# Patient Record
Sex: Male | Born: 1990 | Race: Black or African American | Hispanic: No | Marital: Married
Health system: Southern US, Community
[De-identification: ages and names within clinical notes are randomized; demographics above are authoritative.]

## PROBLEM LIST (undated history)

## (undated) DIAGNOSIS — N200 Calculus of kidney: Secondary | ICD-10-CM

## (undated) DIAGNOSIS — M549 Dorsalgia, unspecified: Secondary | ICD-10-CM

---
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
## 2009-01-31 ENCOUNTER — Emergency Department (HOSPITAL_COMMUNITY): Admission: EM | Admit: 2009-01-31 | Discharge: 2009-02-01 | Payer: Self-pay | Admitting: Emergency Medicine

## 2011-01-09 LAB — RAPID STREP SCREEN (MED CTR MEBANE ONLY): Streptococcus, Group A Screen (Direct): NEGATIVE

## 2011-07-14 ENCOUNTER — Encounter (HOSPITAL_COMMUNITY): Payer: Self-pay | Admitting: *Deleted

## 2011-07-14 ENCOUNTER — Emergency Department (INDEPENDENT_AMBULATORY_CARE_PROVIDER_SITE_OTHER)
Admission: EM | Admit: 2011-07-14 | Discharge: 2011-07-14 | Disposition: A | Discharge status: Home / Self Care | Payer: Self-pay | Source: Home / Self Care | Attending: Emergency Medicine | Admitting: Emergency Medicine

## 2011-07-14 DIAGNOSIS — IMO0002 Reserved for concepts with insufficient information to code with codable children: Secondary | ICD-10-CM

## 2011-07-14 MED ORDER — IBUPROFEN 800 MG PO TABS: 800.0000 mg | ORAL_TABLET | Freq: Three times a day (TID) | ORAL | Status: AC

## 2011-07-14 MED ORDER — CYCLOBENZAPRINE HCL 10 MG PO TABS: 10.0000 mg | ORAL_TABLET | Freq: Three times a day (TID) | ORAL | Status: AC | PRN

## 2011-07-14 NOTE — ED Notes (Signed)
Pt  Reports  Pain r  Shoulder      -  r  Upper  Back  He  denys  Any  Injury      He  Reports  Pain on  rom   He   Reports  The  Pain not  releived  By otc  meds         He  Is  Awake  As  Well as  Alert and  Oriented  Speaking in complete  sentances

## 2011-07-14 NOTE — ED Provider Notes (Signed)
History     CSN: [id number]  Arrival date & time 07/14/11  4098   First MD Initiated Contact with Patient 07/14/11 [phone]      Chief Complaint  Patient presents with  . Shoulder Pain    (Consider location/radiation/quality/duration/timing/severity/associated sxs/prior treatment) Patient is a 21 y.o. male presenting with shoulder pain. The history is provided by the patient.  Shoulder Pain This is a new problem. The current episode started yesterday. The problem occurs constantly. The problem has not changed since onset.Pertinent negatives include no chest pain, no abdominal pain, no headaches and no shortness of breath. The symptoms are aggravated by twisting and bending. He has tried nothing for the symptoms.    History reviewed. No pertinent past medical history.  History reviewed. No pertinent past surgical history.  History reviewed. No pertinent family history.  History  Substance Use Topics  . Smoking status: Not on file  . Smokeless tobacco: Not on file  . Alcohol Use: Not on file      Review of Systems  Constitutional: Negative for fever, chills and diaphoresis.  HENT: Negative for hearing loss, facial swelling and neck pain.   Respiratory: Negative for shortness of breath.   Cardiovascular: Negative for chest pain.  Gastrointestinal: Negative for abdominal pain.  Musculoskeletal: Negative for myalgias and back pain.  Skin: Negative for rash.  Neurological: Negative for headaches.    Allergies  Review of patient's allergies indicates no known allergies.  Home Medications  No current outpatient prescriptions on file.  BP 128/68  Pulse 60  Temp(Src) 98.6 F (37 C) (Oral)  Resp 16  SpO2 100%  Physical Exam  Constitutional: He appears well-developed and well-nourished.  HENT:  Head: Normocephalic.  Eyes: Conjunctivae are normal.  Neck: Neck supple.  Cardiovascular: Normal rate, regular rhythm and normal heart sounds.  Exam reveals no gallop and no  distant heart sounds.   Pulmonary/Chest: Breath sounds normal. No accessory muscle usage. Not tachypneic. No respiratory distress. He has no decreased breath sounds. He has no wheezes. He has no rhonchi. He has no rales.  Abdominal: There is no tenderness.  Musculoskeletal: Normal range of motion. He exhibits no edema and no tenderness.       Right shoulder: He exhibits tenderness, pain and spasm. He exhibits normal range of motion, no swelling, no effusion, no crepitus, no deformity, normal pulse and normal strength.       Arms: Skin: No rash noted. No erythema.    ED Course  Procedures (including critical care time)  Labs Reviewed - No data to display No results found.   No diagnosis found.    MDM  Patient presented to urgent care today complaining of right shoulder pain. Patient describes yesterday he tried to crack his back as he usually does when the pain started suddenly. No further symptoms respiratory symptoms cough or shortness of breath. Incidentally upon patient's discharge patient wanted to discuss all that occasionally he feels sharp pains on his left lower rib cage doesn't happen all the time but has had been occasionally. Patient does not have any pain now have been instructed to be seen if pain was to present itself as concerned today was right upper back and shoulder pain        Jimmie Molly, MD 07/14/11 1003

## 2011-08-26 ENCOUNTER — Encounter (HOSPITAL_COMMUNITY): Payer: Self-pay | Admitting: *Deleted

## 2011-08-26 ENCOUNTER — Emergency Department (HOSPITAL_COMMUNITY)
Admission: EM | Admit: 2011-08-26 | Discharge: 2011-08-27 | Discharge status: Against Medical Advice | Payer: Self-pay | Attending: Emergency Medicine | Admitting: Emergency Medicine

## 2011-08-26 DIAGNOSIS — Z0389 Encounter for observation for other suspected diseases and conditions ruled out: Secondary | ICD-10-CM | POA: Insufficient documentation

## 2011-08-26 NOTE — ED Notes (Signed)
The pt has had lt shoulder pain for one week.  No known injuries

## 2011-08-27 ENCOUNTER — Emergency Department (HOSPITAL_COMMUNITY): Payer: Self-pay

## 2011-08-27 NOTE — ED Notes (Signed)
Unable to locate

## 2013-01-05 ENCOUNTER — Encounter (HOSPITAL_COMMUNITY): Payer: Self-pay | Admitting: Emergency Medicine

## 2013-01-05 ENCOUNTER — Emergency Department (HOSPITAL_COMMUNITY)
Admission: EM | Admit: 2013-01-05 | Discharge: 2013-01-05 | Disposition: A | Discharge status: Home / Self Care | Payer: Self-pay | Attending: Emergency Medicine | Admitting: Emergency Medicine

## 2013-01-05 ENCOUNTER — Emergency Department (HOSPITAL_COMMUNITY): Payer: Self-pay

## 2013-01-05 DIAGNOSIS — Y9301 Activity, walking, marching and hiking: Secondary | ICD-10-CM | POA: Insufficient documentation

## 2013-01-05 DIAGNOSIS — IMO0002 Reserved for concepts with insufficient information to code with codable children: Secondary | ICD-10-CM | POA: Insufficient documentation

## 2013-01-05 DIAGNOSIS — F17200 Nicotine dependence, unspecified, uncomplicated: Secondary | ICD-10-CM | POA: Insufficient documentation

## 2013-01-05 DIAGNOSIS — W108XXA Fall (on) (from) other stairs and steps, initial encounter: Secondary | ICD-10-CM | POA: Insufficient documentation

## 2013-01-05 DIAGNOSIS — Y929 Unspecified place or not applicable: Secondary | ICD-10-CM | POA: Insufficient documentation

## 2013-01-05 DIAGNOSIS — S8391XA Sprain of unspecified site of right knee, initial encounter: Secondary | ICD-10-CM

## 2013-01-05 NOTE — ED Provider Notes (Signed)
Medical screening examination/treatment/procedure(s) were performed by non-physician practitioner and as supervising physician I was immediately available for consultation/collaboration. Devoria Albe, MD, FACEP   Ward Givens, MD 01/05/13 [phone]

## 2013-01-05 NOTE — ED Notes (Signed)
Pt c/o right knee pain x weeks after falling

## 2013-01-05 NOTE — ED Provider Notes (Signed)
CSN: [id number]     Arrival date & time 01/05/13  4098 History  This chart was scribed for Junius Finner, PA, working with Ward Givens, MD by Blanchard Kelch, ED Scribe. This patient was seen in room TR07C/TR07C and the patient's care was started at 9:10 AM.    Chief Complaint  Patient presents with  . Knee Pain    Patient is a 22 y.o. male presenting with knee pain. The history is provided by the patient. No language interpreter was used.  Knee Pain   HPI Comments: Mitchell Martin is a 22 y.o. male who presents to the Emergency Department complaining of waxing and waning outer right knee pain that began a few weeks ago when he fell down some stairs a few weeks ago. He describes the pain as throbbing and a 6-7/10 at its worst. The pain is worsened by movement. He states that he landed on his shin when he fell. He denies LOC at the time. He denies taking anything for the pain. He denies any current swelling, numbness or tingling to the area. He denies any previous injury to the area.     History reviewed. No pertinent past medical history. History reviewed. No pertinent past surgical history. History reviewed. No pertinent family history. History  Substance Use Topics  . Smoking status: Current Every Day Smoker  . Smokeless tobacco: Not on file  . Alcohol Use: No    Review of Systems  Musculoskeletal: Positive for arthralgias.  Neurological: Negative for numbness.  All other systems reviewed and are negative.    Allergies  Review of patient's allergies indicates no known allergies.  Home Medications  No current outpatient prescriptions on file.  Triage Vitals: BP 153/86  Pulse 81  Temp(Src) 97.8 F (36.6 C) (Oral)  Resp 18  SpO2 97%  Physical Exam  Nursing note and vitals reviewed. Constitutional: He is oriented to person, place, and time. He appears well-developed and well-nourished.  HENT:  Head: Normocephalic and atraumatic.  Eyes: EOM are normal.  Neck: Normal  range of motion.  Cardiovascular: Normal rate.   Pulmonary/Chest: Effort normal.  Musculoskeletal: Normal range of motion.  Right knee no edema ecchymosis or erythema. FROM. Tenderness to palpation over lateral aspect. No meniscal tenderness. No medial tenderness. Patella stable and non tender.   Neurological: He is alert and oriented to person, place, and time.  Skin: Skin is warm and dry.  Psychiatric: He has a normal mood and affect. His behavior is normal.    ED Course  Procedures (including critical care time)  DIAGNOSTIC STUDIES: Oxygen Saturation is 97% on room air, adequate by my interpretation.    COORDINATION OF CARE:  9:18 AM -Will order right knee x-ray. Patient verbalizes understanding and agrees with treatment plan.   Labs Review Labs Reviewed - No data to display Imaging Review Dg Knee Complete 4 Views Right  01/05/2013   CLINICAL DATA:  Pain post trauma  EXAM: RIGHT KNEE - COMPLETE 4+ VIEW  COMPARISON:  None.  FINDINGS: Frontal, lateral, and bilateral oblique views were obtained. There is no fracture, dislocation, or effusion. Joint spaces appear intact. No erosive change.  IMPRESSION: No abnormality noted.   Electronically Signed   By: Bretta Bang   On: 01/05/2013 09:48    MDM   1. Right knee sprain, initial encounter    Plain films: no bony abnormality. Discussed use of knee sleeve, pt insisted he has one at home he can use. Discussed R.I.C.E therapy. May use  OTC pain medication. Advised to f/u with [facility] in 1-2 weeks if pt still having knee discomfort.  All labs/imaging/findings discussed with patient. All questions answered, and concerns addressed. Pt verbalized understanding and agreement with tx plan   I personally performed the services described in this documentation, which was scribed in my presence. The recorded information has been reviewed and is accurate.    Junius Finner, PA-C 01/05/13 [phone]

## 2013-03-14 ENCOUNTER — Emergency Department (HOSPITAL_COMMUNITY): Payer: Self-pay

## 2013-03-14 ENCOUNTER — Emergency Department (HOSPITAL_COMMUNITY)
Admission: EM | Admit: 2013-03-14 | Discharge: 2013-03-14 | Disposition: A | Discharge status: Home / Self Care | Payer: Self-pay | Attending: Emergency Medicine | Admitting: Emergency Medicine

## 2013-03-14 ENCOUNTER — Encounter (HOSPITAL_COMMUNITY): Payer: Self-pay | Admitting: Emergency Medicine

## 2013-03-14 DIAGNOSIS — R0781 Pleurodynia: Secondary | ICD-10-CM

## 2013-03-14 DIAGNOSIS — S298XXA Other specified injuries of thorax, initial encounter: Secondary | ICD-10-CM | POA: Insufficient documentation

## 2013-03-14 DIAGNOSIS — F17200 Nicotine dependence, unspecified, uncomplicated: Secondary | ICD-10-CM | POA: Insufficient documentation

## 2013-03-14 MED ORDER — PROMETHAZINE HCL 25 MG PO TABS: 25.0000 mg | ORAL_TABLET | Freq: Four times a day (QID) | ORAL | Status: DC | PRN

## 2013-03-14 MED ORDER — HYDROCODONE-ACETAMINOPHEN 5-325 MG PO TABS
2.0000 | ORAL_TABLET | Freq: Once | ORAL | Status: AC
  Administered 2013-03-14: 2 via ORAL
  Filled 2013-03-14: qty 2

## 2013-03-14 MED ORDER — HYDROCODONE-ACETAMINOPHEN 5-325 MG PO TABS: 2.0000 | ORAL_TABLET | Freq: Four times a day (QID) | ORAL | Status: DC | PRN

## 2013-03-14 MED ORDER — ONDANSETRON 4 MG PO TBDP
8.0000 mg | ORAL_TABLET | Freq: Once | ORAL | Status: AC
  Administered 2013-03-14: 8 mg via ORAL
  Filled 2013-03-14: qty 2

## 2013-03-14 NOTE — ED Provider Notes (Signed)
Medical screening examination/treatment/procedure(s) were performed by non-physician practitioner and as supervising physician I was immediately available for consultation/collaboration.  EKG Interpretation   None         Kregg Cihlar M Galileo Colello, MD 03/14/13 1651 
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
## 2013-08-13 ENCOUNTER — Emergency Department (HOSPITAL_COMMUNITY): Admission: EM | Admit: 2013-08-13 | Discharge: 2013-08-13 | Payer: Self-pay | Source: Home / Self Care

## 2013-08-13 ENCOUNTER — Emergency Department (HOSPITAL_COMMUNITY)
Admission: EM | Admit: 2013-08-13 | Discharge: 2013-08-13 | Disposition: A | Discharge status: Home / Self Care | Payer: Self-pay | Attending: Emergency Medicine | Admitting: Emergency Medicine

## 2013-08-13 ENCOUNTER — Encounter (HOSPITAL_COMMUNITY): Payer: Self-pay | Admitting: Emergency Medicine

## 2013-08-13 DIAGNOSIS — F17200 Nicotine dependence, unspecified, uncomplicated: Secondary | ICD-10-CM | POA: Insufficient documentation

## 2013-08-13 DIAGNOSIS — H5711 Ocular pain, right eye: Secondary | ICD-10-CM

## 2013-08-13 DIAGNOSIS — H579 Unspecified disorder of eye and adnexa: Secondary | ICD-10-CM | POA: Insufficient documentation

## 2013-08-13 DIAGNOSIS — H5710 Ocular pain, unspecified eye: Secondary | ICD-10-CM | POA: Insufficient documentation

## 2013-08-13 MED ORDER — FLUORESCEIN SODIUM 1 MG OP STRP
1.0000 | ORAL_STRIP | Freq: Once | OPHTHALMIC | Status: AC
  Administered 2013-08-13: 10:00:00 via OPHTHALMIC
  Filled 2013-08-13: qty 1

## 2013-08-13 MED ORDER — HYDROCODONE-ACETAMINOPHEN 5-325 MG PO TABS: 1.0000 | ORAL_TABLET | ORAL | Status: DC | PRN

## 2013-08-13 MED ORDER — TETRACAINE HCL 0.5 % OP SOLN
1.0000 [drp] | Freq: Once | OPHTHALMIC | Status: AC
  Administered 2013-08-13: 1 [drp] via OPHTHALMIC
  Filled 2013-08-13: qty 2

## 2013-08-13 MED ORDER — ERYTHROMYCIN 5 MG/GM OP OINT: 1.0000 "application " | TOPICAL_OINTMENT | Freq: Four times a day (QID) | OPHTHALMIC | Status: DC

## 2013-08-13 NOTE — ED Provider Notes (Signed)
CSN: [id number]     Arrival date & time 08/13/13  0918 History   This chart was scribed for non-physician practitioner, Mellody DrownLauren Jerelene Salaam, working with Gavin PoundMichael Y. Oletta LamasGhim, MD, by Tana ConchStephen Methvin ED Scribe. This patient was seen in TR04C/TR04C and the patient's care was started at 9:45 AM.    Chief Complaint  Patient presents with  . Eye Drainage      The history is provided by the patient. No language interpreter was used.   HPI Comments: Mitchell Martin is a 23 y.o. male who presents to the Emergency Department complaining of right eye pain, he "woke up a few days ago and his eye was painful ". He states that it "feels like something is stuck in the corner" and he feels it "when I close my eyes, a poking and scratching". Pt reports associated discharge, itchiness, and redness. 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 He denies any change in vision.   History reviewed. No pertinent past medical history. History reviewed. No pertinent past surgical history. No family history on file. History  Substance Use Topics  . Smoking status: Current Every Day Smoker  . Smokeless tobacco: Not on file  . Alcohol Use: No    Review of Systems  Constitutional: Negative for fever and chills.  Eyes: Positive for pain, discharge, redness and itching. Negative for visual disturbance.  Skin: Negative for rash.  Neurological: Negative for syncope, numbness and headaches.  All other systems reviewed and are negative.     Allergies  Review of patient's allergies indicates no known allergies.  Home Medications   Prior to Admission medications   Medication Sig Start Date End Date Taking? Authorizing Provider  HYDROcodone-acetaminophen (NORCO/VICODIN) 5-325 MG per tablet Take 2 tablets by mouth every 6 (six) hours as needed. 03/14/13   Mora BellmanHannah S Merrell, PA-C  naproxen sodium (ANAPROX) 220 MG tablet Take 440 mg by mouth 2 (two) times daily as needed (for  pain).     Historical Provider, MD  promethazine (PHENERGAN) 25 MG tablet Take 1 tablet (25 mg total) by mouth every 6 (six) hours as needed for nausea or vomiting. 03/14/13   Ramon DredgeHannah S Merrell, PA-C   BP 132/69  Pulse 76  Temp(Src) 98.2 F (36.8 C) (Oral)  Resp 18  SpO2 100% Physical Exam  Nursing note and vitals reviewed. Constitutional: He is oriented to person, place, and time. He appears well-developed and well-nourished. No distress.  HENT:  Head: Normocephalic and atraumatic.  Right eye  Injected cornea, watery discharge, no obvious foreign bodies  Eyes: EOM are normal. Pupils are equal, round, and reactive to light. Lids are everted and swept, no foreign bodies found. Right eye exhibits discharge. Right eye exhibits no exudate. No foreign body present in the right eye. Right conjunctiva is injected. Right conjunctiva has no hemorrhage.  Slit lamp exam:      The right eye shows no corneal abrasion, no corneal ulcer, no foreign body and no fluorescein uptake.  Clear watery discharge from Right eye  Neck: Normal range of motion. Neck supple.  Pulmonary/Chest: Effort normal. No respiratory distress.  Musculoskeletal: Normal range of motion.  Lymphadenopathy:    He has no cervical adenopathy.  Neurological: He is alert and oriented to person, place, and time.  Skin: Skin is warm and dry. He is not diaphoretic.  Psychiatric: He has a normal mood and affect. His behavior is normal.    ED Course  Procedures (including critical care time)   COORDINATION OF CARE:  10:03 AM- Applied tetracaine and fluorescein to pt's eye. Performed eye exam. Told pt of the need to f/u an eye doctor. 9:55 AM-Discussed treatment plan which includes eye exam with pt at bedside and pt agreed to plan.    MDM   Final diagnoses:  Pain in right eye   Right eye pain and erythema, watery discharge.  No signs of orbital or periorbital cellulitis. No corneal ulcer or FB identified.  Will treat for  possible infection and follow up with an eye specialist. Discussed treatment plan with the patient. Return precautions given. Reports understanding and no other concerns at this time.  Patient is stable for discharge at this time.  Meds given in ED:  Medications  tetracaine (PONTOCAINE) 0.5 % ophthalmic solution 1 drop (1 drop Both Eyes Given 08/13/13 0954)  fluorescein ophthalmic strip 1 strip ( Both Eyes Given 08/13/13 0954)    New Prescriptions   ERYTHROMYCIN OPHTHALMIC OINTMENT    Place 1 application into the right eye 4 (four) times daily. Place 1/2 inch ribbon of ointment in the affected eye 4 times a day   HYDROCODONE-ACETAMINOPHEN (NORCO/VICODIN) 5-325 MG PER TABLET    Take 1 tablet by mouth every 4 (four) hours as needed.     I personally performed the services described in this documentation, which was scribed in my presence. The recorded information has been reviewed and is accurate.    Clabe SealLauren M Caley Ciaramitaro, PA-C 08/14/13 [phone]

## 2013-08-13 NOTE — ED Notes (Signed)
Pt reports right eye redness and drainage for a few days. 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 Denies change in vision.

## 2013-08-13 NOTE — Discharge Instructions (Signed)
Call for a follow up appointment with a Family or Primary Care Provider.  Call for an appointment with an Opthlmologist (Eye specialist) for further evaluation of your eye pain and redness. Return if Symptoms worsen.   Take medication as prescribed.

## 2013-08-17 NOTE — ED Provider Notes (Signed)
Medical screening examination/treatment/procedure(s) were performed by non-physician practitioner and as supervising physician I was immediately available for consultation/collaboration.   EKG Interpretation None        Gavin PoundMichael Y. Oletta LamasGhim, MD 08/17/13 [id number]

## 2014-10-16 ENCOUNTER — Encounter (HOSPITAL_BASED_OUTPATIENT_CLINIC_OR_DEPARTMENT_OTHER): Payer: Self-pay

## 2014-10-16 ENCOUNTER — Emergency Department (HOSPITAL_BASED_OUTPATIENT_CLINIC_OR_DEPARTMENT_OTHER)
Admission: EM | Admit: 2014-10-16 | Discharge: 2014-10-16 | Disposition: A | Discharge status: Home / Self Care | Payer: No Typology Code available for payment source | Attending: Emergency Medicine | Admitting: Emergency Medicine

## 2014-10-16 DIAGNOSIS — Y998 Other external cause status: Secondary | ICD-10-CM | POA: Diagnosis not present

## 2014-10-16 DIAGNOSIS — S3992XA Unspecified injury of lower back, initial encounter: Secondary | ICD-10-CM | POA: Insufficient documentation

## 2014-10-16 DIAGNOSIS — Z87891 Personal history of nicotine dependence: Secondary | ICD-10-CM | POA: Insufficient documentation

## 2014-10-16 DIAGNOSIS — Y92410 Unspecified street and highway as the place of occurrence of the external cause: Secondary | ICD-10-CM | POA: Diagnosis not present

## 2014-10-16 DIAGNOSIS — Y9389 Activity, other specified: Secondary | ICD-10-CM | POA: Insufficient documentation

## 2014-10-16 DIAGNOSIS — M545 Low back pain, unspecified: Secondary | ICD-10-CM

## 2014-10-16 MED ORDER — NAPROXEN 500 MG PO TABS: 500.0000 mg | ORAL_TABLET | Freq: Two times a day (BID) | ORAL | Status: DC
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
## 2015-10-12 ENCOUNTER — Encounter (HOSPITAL_BASED_OUTPATIENT_CLINIC_OR_DEPARTMENT_OTHER): Payer: Self-pay | Admitting: Emergency Medicine

## 2015-10-12 ENCOUNTER — Emergency Department (HOSPITAL_BASED_OUTPATIENT_CLINIC_OR_DEPARTMENT_OTHER)
Admission: EM | Admit: 2015-10-12 | Discharge: 2015-10-12 | Disposition: A | Discharge status: Home / Self Care | Payer: BLUE CROSS/BLUE SHIELD | Attending: Emergency Medicine | Admitting: Emergency Medicine

## 2015-10-12 DIAGNOSIS — L02416 Cutaneous abscess of left lower limb: Secondary | ICD-10-CM | POA: Insufficient documentation

## 2015-10-12 DIAGNOSIS — L0231 Cutaneous abscess of buttock: Secondary | ICD-10-CM | POA: Insufficient documentation

## 2015-10-12 DIAGNOSIS — Z87891 Personal history of nicotine dependence: Secondary | ICD-10-CM | POA: Diagnosis not present

## 2015-10-12 DIAGNOSIS — L02415 Cutaneous abscess of right lower limb: Secondary | ICD-10-CM | POA: Insufficient documentation

## 2015-10-12 DIAGNOSIS — L0291 Cutaneous abscess, unspecified: Secondary | ICD-10-CM

## 2015-10-12 MED ORDER — SULFAMETHOXAZOLE-TRIMETHOPRIM 800-160 MG PO TABS: 1.0000 | ORAL_TABLET | Freq: Two times a day (BID) | ORAL | Status: AC

## 2015-10-12 NOTE — ED Provider Notes (Signed)
CSN: [id number]     Arrival date & time 10/12/15  1037 History   First MD Initiated Contact with Patient 10/12/15 1041     Chief Complaint  Patient presents with  . Abscess     (Consider location/radiation/quality/duration/timing/severity/associated sxs/prior Treatment) Patient is a 25 y.o. male presenting with abscess. The history is provided by the patient.  Abscess Location:  Leg and ano-genital Ano-genital abscess location:  L buttock Leg abscess location:  R knee and L upper leg Abscess quality: draining, fluctuance, induration and painful   Red streaking: no   Duration:  1 week Progression:  Improving Pain details:    Quality:  Sharp and throbbing   Severity:  Moderate   Timing:  Constant   Progression:  Unchanged Chronicity:  New Context comment:  Recently moved in with his sister in the last few weeks and she suffers from similar skin issues Relieved by:  Draining/squeezing, warm water soaks and warm compresses Worsened by:  Nothing tried Ineffective treatments:  None tried Associated symptoms: no fever, no nausea and no vomiting   Risk factors: family hx of MRSA   Risk factors: no prior abscess     History reviewed. No pertinent past medical history. History reviewed. No pertinent past surgical history. History reviewed. No pertinent family history. Social History  Substance Use Topics  . Smoking status: Former Games developermoker  . Smokeless tobacco: None  . Alcohol Use: No    Review of Systems  Constitutional: Negative for fever.  Gastrointestinal: Negative for nausea and vomiting.  All other systems reviewed and are negative.     Allergies  Review of patient's allergies indicates no known allergies.  Home Medications   Prior to Admission medications   Medication Sig Start Date End Date Taking? Authorizing Provider  sulfamethoxazole-trimethoprim (BACTRIM DS,SEPTRA DS) 800-160 MG tablet Take 1 tablet by mouth 2 (two) times daily. 10/12/15 10/19/15  Gwyneth SproutWhitney  Cydne Grahn, MD   BP 152/95 mmHg  Pulse 79  Temp(Src) 98.2 F (36.8 C) (Oral)  Resp 18  Ht 5\' 11"  (1.803 m)  Wt 175 lb (79.379 kg)  BMI 24.42 kg/m2  SpO2 100% Physical Exam  Constitutional: He is oriented to person, place, and time. He appears well-developed and well-nourished. No distress.  HENT:  Head: Normocephalic and atraumatic.  Mouth/Throat: Oropharynx is clear and moist.  Eyes: Conjunctivae and EOM are normal. Pupils are equal, round, and reactive to light.  Neck: Normal range of motion. Neck supple.  Cardiovascular: Normal rate, regular rhythm and intact distal pulses.   No murmur heard. Pulmonary/Chest: Effort normal and breath sounds normal. No respiratory distress. He has no wheezes. He has no rales.  Abdominal: Soft. He exhibits no distension. There is no tenderness. There is no rebound and no guarding.  Musculoskeletal: Normal range of motion. He exhibits no edema or tenderness.  Neurological: He is alert and oriented to person, place, and time.  Skin: Skin is warm and dry. Lesion noted. No rash noted. No erythema.     3 abscesses present on the right knee, left upper leg and left buttocks. The right knee and left buttocks with some minor induration but otherwise steal with no fluctuance or draining. A left buttocks abscess with purulent drainage, tenderness and induration. No surrounding erythema  Psychiatric: He has a normal mood and affect. His behavior is normal.  Nursing note and vitals reviewed.   ED Course  Procedures (including critical care time) Labs Review Labs Reviewed - No data to display  Imaging Review No  results found. I have personally reviewed and evaluated these images and lab results as part of my medical decision-making.   EKG Interpretation None      MDM   Final diagnoses:  Abscess    Patient is a healthy 25 rolled male presenting today with evidence of an abscess to the left buttocks. This is the third abscess he's had in the last  2 weeks. He recently moved in with his sister who also gets these. He is not systemically ill and currently the abscess is draining. He will not need I&D today but given the recurrent nature of multiple areas will start on Bactrim.    Gwyneth SproutWhitney Frady Taddeo, MD 10/12/15 1053

## 2015-10-12 NOTE — ED Notes (Signed)
Pt with ascess noted to buttocks, states that he is getting bit by something over the last week by a spider,

## 2015-10-12 NOTE — Discharge Instructions (Signed)

## 2015-10-12 NOTE — ED Notes (Signed)
MD at bedside. 

## 2016-06-03 ENCOUNTER — Encounter (HOSPITAL_BASED_OUTPATIENT_CLINIC_OR_DEPARTMENT_OTHER): Payer: Self-pay | Admitting: *Deleted

## 2016-06-03 ENCOUNTER — Emergency Department (HOSPITAL_BASED_OUTPATIENT_CLINIC_OR_DEPARTMENT_OTHER)
Admission: EM | Admit: 2016-06-03 | Discharge: 2016-06-03 | Disposition: A | Discharge status: Home / Self Care | Payer: BLUE CROSS/BLUE SHIELD | Attending: Emergency Medicine | Admitting: Emergency Medicine

## 2016-06-03 DIAGNOSIS — Z87891 Personal history of nicotine dependence: Secondary | ICD-10-CM | POA: Diagnosis not present

## 2016-06-03 DIAGNOSIS — Z202 Contact with and (suspected) exposure to infections with a predominantly sexual mode of transmission: Secondary | ICD-10-CM | POA: Diagnosis not present

## 2016-06-03 LAB — URINALYSIS, MICROSCOPIC (REFLEX)

## 2016-06-03 LAB — URINALYSIS, ROUTINE W REFLEX MICROSCOPIC
Bilirubin Urine: NEGATIVE
GLUCOSE, UA: NEGATIVE mg/dL
Hgb urine dipstick: NEGATIVE
KETONES UR: NEGATIVE mg/dL
NITRITE: NEGATIVE
PROTEIN: NEGATIVE mg/dL
Specific Gravity, Urine: 1.021 (ref 1.005–1.030)
pH: 7 (ref 5.0–8.0)

## 2016-06-03 MED ORDER — ONDANSETRON 4 MG PO TBDP
4.0000 mg | ORAL_TABLET | Freq: Once | ORAL | Status: AC
  Administered 2016-06-03: 4 mg via ORAL
  Filled 2016-06-03: qty 1

## 2016-06-03 MED ORDER — LIDOCAINE HCL (PF) 1 % IJ SOLN
INTRAMUSCULAR | Status: AC
  Administered 2016-06-03: 2.3 mL
  Filled 2016-06-03: qty 5

## 2016-06-03 MED ORDER — METRONIDAZOLE 500 MG PO TABS: 500.0000 mg | ORAL_TABLET | Freq: Two times a day (BID) | ORAL | 0 refills | Status: DC

## 2016-06-03 MED ORDER — CEFTRIAXONE SODIUM 250 MG IJ SOLR
250.0000 mg | Freq: Once | INTRAMUSCULAR | Status: AC
  Administered 2016-06-03: 250 mg via INTRAMUSCULAR
  Filled 2016-06-03: qty 250

## 2016-06-03 MED ORDER — AZITHROMYCIN 250 MG PO TABS
1000.0000 mg | ORAL_TABLET | Freq: Once | ORAL | Status: AC
  Administered 2016-06-03: 1000 mg via ORAL
  Filled 2016-06-03: qty 4

## 2016-06-03 MED FILL — metroNIDAZOLE 500 MG TABS: 500 | 7 days supply | Qty: 14 | Fill #0

## 2016-06-03 NOTE — ED Provider Notes (Signed)
[facility]-EMERGENCY DEPT Provider Note   CSN: [id number] Arrival date & time: 06/03/16 1350     History    Chief Complaint  Patient presents with  . Exposure to STD     HPI MILANO ROSEVEAR is a 26 y.o. male.  26yo M who presents for STD testing. Patient states that his wife was recently diagnosed with Trichomonas and he requests STD testing. He denies any symptoms or complaints today. Specifically, he has no urinary symptoms, penile discharge, abdominal pain, vomiting, fevers, or recent illness.   History reviewed. No pertinent past medical history.   There are no active problems to display for this patient.   History reviewed. No pertinent surgical history.      Home Medications    Prior to Admission medications   Medication Sig Start Date End Date Taking? Authorizing Provider  metroNIDAZOLE (FLAGYL) 500 MG tablet Take 1 tablet (500 mg total) by mouth 2 (two) times daily. 06/03/16   Laurence Spates, MD      History reviewed. No pertinent family history.   Social History  Substance Use Topics  . Smoking status: Former Games developer  . Smokeless tobacco: Not on file  . Alcohol use No     Allergies     Patient has no known allergies.    Review of Systems  10 Systems reviewed and are negative for acute change except as noted in the HPI.   Physical Exam Updated Vital Signs BP 127/83 (BP Location: Right Arm)   Pulse 63   Temp 98.1 F (36.7 C) (Oral)   Resp 16   Ht 5\' 11"  (1.803 m)   Wt 174 lb (78.9 kg)   SpO2 100%   BMI 24.27 kg/m   Physical Exam  Constitutional: He is oriented to person, place, and time. He appears well-developed and well-nourished. No distress.  HENT:  Head: Normocephalic and atraumatic.  Eyes: Conjunctivae are normal.  Neck: Neck supple.  Cardiovascular: Normal rate, regular rhythm and normal heart sounds.   No murmur heard. Pulmonary/Chest: Effort normal and breath sounds normal.  Abdominal: Soft. Bowel sounds are  normal. He exhibits no distension. There is no tenderness.  Musculoskeletal: He exhibits no edema.  Neurological: He is alert and oriented to person, place, and time.  Fluent speech  Skin: Skin is warm and dry.  Psychiatric: He has a normal mood and affect. Judgment normal.  Nursing note and vitals reviewed.     ED Treatments / Results  Labs (all labs ordered are listed, but only abnormal results are displayed) Labs Reviewed  URINALYSIS, ROUTINE W REFLEX MICROSCOPIC - Abnormal; Notable for the following:       Result Value   APPearance CLOUDY (*)    Leukocytes, UA LARGE (*)    All other components within normal limits  URINALYSIS, MICROSCOPIC (REFLEX) - Abnormal; Notable for the following:    Bacteria, UA MANY (*)    Squamous Epithelial / LPF 0-5 (*)    All other components within normal limits  GC/CHLAMYDIA PROBE AMP ([place]) NOT AT [facility]     EKG  EKG Interpretation  Date/Time:    Ventricular Rate:    PR Interval:    QRS Duration:   QT Interval:    QTC Calculation:   R Axis:     Text Interpretation:           Radiology No results found.  Procedures Procedures (including critical care time) Procedures  Medications Ordered in ED  Medications  ondansetron (ZOFRAN-ODT) disintegrating  tablet 4 mg (not administered)  azithromycin (ZITHROMAX) tablet 1,000 mg (not administered)  cefTRIAXone (ROCEPHIN) injection 250 mg (not administered)     Initial Impression / Assessment and Plan / ED Course  I have reviewed the triage vital signs and the nursing notes.  Pertinent labs that were available during my care of the patient were reviewed by me and considered in my medical decision making (see chart for details).     Pt here for routine STD testing because of recently diagnosed with Trichomonas. No complaints. UA sent from triage shows large leukocytes, many bacteria suggestive of infection. Gave ceftriaxone, azithromycin with Zofran, and prescription for  Flagyl. Cautioned not to use alcohol during Flagyl course. Instructed that health department is more appropriate place for routine STD screening and provided him with this follow-up information. Reviewed return precautions. Instructed to have all partners tested and treated prior to any intercourse. Patient voiced understanding and was discharged in satisfactory condition.  Final Clinical Impressions(s) / ED Diagnoses   Final diagnoses:  STD exposure     New Prescriptions   METRONIDAZOLE (FLAGYL) 500 MG TABLET    Take 1 tablet (500 mg total) by mouth 2 (two) times daily.       Laurence Spatesachel Morgan Tereza Gilham, MD 06/03/16 [phone]

## 2016-06-03 NOTE — ED Triage Notes (Signed)
Pt requesting STD test, wife dx with trich today

## 2016-06-04 LAB — GC/CHLAMYDIA PROBE AMP (~~LOC~~) NOT AT ARMC
Chlamydia: NEGATIVE
Neisseria Gonorrhea: NEGATIVE

## 2016-09-11 ENCOUNTER — Emergency Department (HOSPITAL_COMMUNITY)
Admission: EM | Admit: 2016-09-11 | Discharge: 2016-09-11 | Disposition: A | Discharge status: Home / Self Care | Payer: Worker's Compensation | Attending: Emergency Medicine | Admitting: Emergency Medicine

## 2016-09-11 ENCOUNTER — Encounter (HOSPITAL_COMMUNITY): Payer: Self-pay

## 2016-09-11 DIAGNOSIS — W319XXA Contact with unspecified machinery, initial encounter: Secondary | ICD-10-CM | POA: Insufficient documentation

## 2016-09-11 DIAGNOSIS — T23271A Burn of second degree of right wrist, initial encounter: Secondary | ICD-10-CM | POA: Diagnosis not present

## 2016-09-11 DIAGNOSIS — Z87891 Personal history of nicotine dependence: Secondary | ICD-10-CM | POA: Diagnosis not present

## 2016-09-11 DIAGNOSIS — S39012A Strain of muscle, fascia and tendon of lower back, initial encounter: Secondary | ICD-10-CM | POA: Diagnosis not present

## 2016-09-11 DIAGNOSIS — Y990 Civilian activity done for income or pay: Secondary | ICD-10-CM | POA: Insufficient documentation

## 2016-09-11 DIAGNOSIS — Y9389 Activity, other specified: Secondary | ICD-10-CM | POA: Insufficient documentation

## 2016-09-11 DIAGNOSIS — S3992XA Unspecified injury of lower back, initial encounter: Secondary | ICD-10-CM | POA: Diagnosis present

## 2016-09-11 DIAGNOSIS — Y929 Unspecified place or not applicable: Secondary | ICD-10-CM | POA: Diagnosis not present

## 2016-09-11 HISTORY — DX: Dorsalgia, unspecified: M54.9

## 2016-09-11 MED ORDER — METHOCARBAMOL 500 MG PO TABS: 500.0000 mg | ORAL_TABLET | Freq: Three times a day (TID) | ORAL | 0 refills | Status: DC

## 2016-09-11 MED ORDER — DICLOFENAC SODIUM 75 MG PO TBEC: 75.0000 mg | DELAYED_RELEASE_TABLET | Freq: Two times a day (BID) | ORAL | 0 refills | Status: DC

## 2016-09-11 MED ORDER — HYDROCODONE-ACETAMINOPHEN 5-325 MG PO TABS: ORAL_TABLET | ORAL | 0 refills | Status: DC

## 2016-09-11 MED ORDER — DIAZEPAM 5 MG PO TABS
10.0000 mg | ORAL_TABLET | Freq: Once | ORAL | Status: AC
  Administered 2016-09-11: 10 mg via ORAL
  Filled 2016-09-11: qty 2

## 2016-09-11 MED ORDER — OXYCODONE-ACETAMINOPHEN 5-325 MG PO TABS
1.0000 | ORAL_TABLET | Freq: Once | ORAL | Status: AC
  Administered 2016-09-11: 1 via ORAL
  Filled 2016-09-11: qty 1

## 2016-09-11 MED ORDER — SILVER SULFADIAZINE 1 % EX CREA
TOPICAL_CREAM | Freq: Once | CUTANEOUS | Status: AC
  Administered 2016-09-11: 22:00:00 via TOPICAL
  Filled 2016-09-11: qty 50

## 2016-09-11 MED ORDER — KETOROLAC TROMETHAMINE 60 MG/2ML IM SOLN
60.0000 mg | Freq: Once | INTRAMUSCULAR | Status: AC
  Administered 2016-09-11: 60 mg via INTRAMUSCULAR
  Filled 2016-09-11: qty 2
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
## 2016-09-11 NOTE — ED Provider Notes (Signed)
[facility]-EM[facility]Y DEPT Provider Note   CSN: [id number] Arrival date & time: 09/11/16  1950     History   Chief Complaint Chief Complaint  Patient presents with  . Back Pain  . Hand Burn    HPI Mitchell Martin is a 26 y.o. male.  HPI   Mitchell Martin is a 26 y.o. male who presents to the Emergency Department complaining of low back pain that began today while pulling on a machine at his place of employment.  He states that he was pulling back on the machine earlier today when he felt a sharp pain across his lower back and into both hips and thighs.  Pain has been gradually increasing since.  Pain is associated with movement, improves slightly with standing.  He denies fall, numbness, weakness of the lower extremities, abd pain, urine or bowel changes.  He also requests evaluation of a burn to his right wrist that occurred one day prior.  States that he burned his wrist on a machine at work.  He did not file the injuries under worker comp.  He has cleaned the burn with saline and applied a bandage.     Past Medical History:  Diagnosis Date  . Back pain     There are no active problems to display for this patient.   History reviewed. No pertinent surgical history.     Home Medications    Prior to Admission medications   Medication Sig Start Date End Date Taking? Authorizing Provider  acetaminophen (TYLENOL) 325 MG tablet Take 650 mg by mouth every 6 (six) hours as needed for mild pain or headache.   Yes [provider]  metroNIDAZOLE (FLAGYL) 500 MG tablet Take 1 tablet (500 mg total) by mouth 2 (two) times daily. Patient not taking: Reported on 09/11/2016 06/03/16   Little, Ambrose Finland, MD    Family History No family history on file.  Social History Social History  Substance Use Topics  . Smoking status: Former Games developer  . Smokeless tobacco: Never Used  . Alcohol use No     Allergies   Patient has no known allergies.   Review of Systems Review of  Systems  Constitutional: Negative for fever.  Respiratory: Negative for shortness of breath.   Gastrointestinal: Negative for abdominal pain, constipation and vomiting.  Genitourinary: Negative for decreased urine volume, difficulty urinating, dysuria, flank pain and hematuria.  Musculoskeletal: Positive for back pain. Negative for joint swelling.  Skin: Negative for rash.  Neurological: Negative for weakness and numbness.  All other systems reviewed and are negative.    Physical Exam Updated Vital Signs BP (!) 151/92 (BP Location: Left Arm)   Pulse 72   Temp 97.9 F (36.6 C) (Oral)   Resp 16   Ht 5\' 11"  (1.803 m)   Wt 79.4 kg (175 lb)   SpO2 100%   BMI 24.41 kg/m   Physical Exam  Constitutional: He is oriented to person, place, and time. He appears well-developed and well-nourished. No distress.  HENT:  Head: Normocephalic and atraumatic.  Neck: Normal range of motion. Neck supple.  Cardiovascular: Normal rate, regular rhythm, normal heart sounds and intact distal pulses.   No murmur heard. Pulmonary/Chest: Effort normal and breath sounds normal. No respiratory distress.  Abdominal: Soft. He exhibits no distension. There is no tenderness.  Musculoskeletal: He exhibits tenderness. He exhibits no edema.       Lumbar back: He exhibits tenderness and pain. He exhibits normal range of motion, no swelling,  no deformity, no laceration and normal pulse.  ttp of the lower lumbar spine and bilateral lumbar paraspinal muscles.  Pt has 5/5 strength against resistance of bilateral lower extremities.     Neurological: He is alert and oriented to person, place, and time. He has normal strength. No sensory deficit. He exhibits normal muscle tone. Coordination and gait normal.  Reflex Scores:      Patellar reflexes are 2+ on the right side and 2+ on the left side.      Achilles reflexes are 2+ on the right side and 2+ on the left side. Skin: Skin is warm and dry. Capillary refill takes less  than 2 seconds. No rash noted.  4cm linear appearing partial thickness burn of the right distal wrist.  Sloughing of the skin present.    Nursing note and vitals reviewed.    ED Treatments / Results  Labs (all labs ordered are listed, but only abnormal results are displayed) Labs Reviewed - No data to display  EKG  EKG Interpretation None       Radiology No results found.  Procedures Procedures (including critical care time)  Medications Ordered in ED Medications  oxyCODONE-acetaminophen (PERCOCET/ROXICET) 5-325 MG per tablet 1 tablet (not administered)  silver sulfADIAZINE (SILVADENE) 1 % cream (not administered)  ketorolac (TORADOL) injection 60 mg (60 mg Intramuscular Given 09/11/16 2053)  diazepam (VALIUM) tablet 10 mg (10 mg Oral Given 09/11/16 2053)     Initial Impression / Assessment and Plan / ED Course  I have reviewed the triage vital signs and the nursing notes.  Pertinent labs & imaging results that were available during my care of the patient were reviewed by me and considered in my medical decision making (see chart for details).     Pt well appearing.  No focal neuro deficits on exam.  ambulates with a slow but steady gait.  No concerning sx's for emergent neurological process.   Burn cleaned and dressed with silvadene.   Final Clinical Impressions(s) / ED Diagnoses   Final diagnoses:  Strain of lumbar region, initial encounter  Partial thickness burn of right wrist, initial encounter    New Prescriptions New Prescriptions   No medications on file     Pauline Ausriplett, Kaysen Sefcik, Cordelia Poche-C 09/12/16 1418    Eber HongMiller, Brian, MD 09/13/16 [phone]

## 2016-09-22 ENCOUNTER — Encounter (HOSPITAL_COMMUNITY): Payer: Self-pay

## 2016-09-22 ENCOUNTER — Ambulatory Visit (HOSPITAL_COMMUNITY): Payer: BLUE CROSS/BLUE SHIELD | Admitting: Physical Therapy

## 2016-09-22 ENCOUNTER — Telehealth (HOSPITAL_COMMUNITY): Payer: Self-pay | Admitting: Physical Therapy

## 2016-09-22 NOTE — Telephone Encounter (Signed)
Pt came in at 3:44 stated he could not find out office, rescheduled for Thurs.

## 2016-09-24 ENCOUNTER — Telehealth (HOSPITAL_COMMUNITY): Payer: Self-pay | Admitting: Physical Therapy

## 2016-09-24 ENCOUNTER — Encounter (HOSPITAL_COMMUNITY): Payer: Self-pay

## 2016-09-24 ENCOUNTER — Ambulatory Visit (HOSPITAL_COMMUNITY): Payer: BLUE CROSS/BLUE SHIELD | Admitting: Physical Therapy

## 2016-09-24 ENCOUNTER — Ambulatory Visit (HOSPITAL_COMMUNITY): Payer: Self-pay | Attending: Preventative Medicine | Admitting: Physical Therapy

## 2016-09-24 DIAGNOSIS — X088XXS Exposure to other specified smoke, fire and flames, sequela: Secondary | ICD-10-CM | POA: Insufficient documentation

## 2016-09-24 DIAGNOSIS — R29898 Other symptoms and signs involving the musculoskeletal system: Secondary | ICD-10-CM | POA: Insufficient documentation

## 2016-09-24 DIAGNOSIS — T2220XS Burn of second degree of shoulder and upper limb, except wrist and hand, unspecified site, sequela: Secondary | ICD-10-CM

## 2016-09-24 DIAGNOSIS — M545 Low back pain: Secondary | ICD-10-CM | POA: Insufficient documentation

## 2016-09-24 DIAGNOSIS — R252 Cramp and spasm: Secondary | ICD-10-CM | POA: Insufficient documentation

## 2016-09-24 DIAGNOSIS — T23271S Burn of second degree of right wrist, sequela: Secondary | ICD-10-CM | POA: Insufficient documentation

## 2016-09-24 DIAGNOSIS — M79601 Pain in right arm: Secondary | ICD-10-CM | POA: Insufficient documentation

## 2016-09-24 NOTE — Therapy (Signed)
[facility] [address] [place], [place], [address] Phone: [phone]   Fax:  [phone]  Wound Care Evaluation  Patient Details  Name: Mitchell Martin MRN: [id number] Date of Birth: 08/15/1990 Referring Provider: Laverle Hobby  Encounter Date: 09/24/2016      PT End of Session - 09/24/16 1549    Visit Number 1   Number of Visits 8   Date for PT Re-Evaluation 10/24/16   Authorization Type work comp   Authorization - Visit Number 1   Authorization - Number of Visits 10   PT Start Time 1445   PT Stop Time 1515   PT Time Calculation (min) 30 min   Activity Tolerance Patient tolerated treatment well   Behavior During Therapy [facility] for tasks assessed/performed      Past Medical History:  Diagnosis Date  . Back pain     No past surgical history on file.  There were no vitals filed for this visit.        [facility] PT Assessment - 09/24/16 0001      Assessment   Medical Diagnosis Rt UE second degree burn    Referring Provider Laverle Hobby   Onset Date/Surgical Date 09/11/16   Next MD Visit 09/25/2016     Precautions   Precautions None     Restrictions   Weight Bearing Restrictions No     Balance Screen   Has the patient fallen in the past 6 months No   Has the patient had a decrease in activity level because of a fear of falling?  No   Is the patient reluctant to leave their home because of a fear of falling?  No     Home Environment   Living Environment Private residence     Prior Function   Level of Independence Independent   Vocation Full time employment   Vocation Requirements On feet, works with machines      Cognition   Overall Cognitive Status Within Functional Limits for tasks assessed         Wound Therapy - 09/24/16 1505    Subjective Mr. Teed states that his Rt arm was injured at work when a machine came down on his arm .   Patient and Family Stated Goals No pain and burn to be healed    Date  of Onset 09/11/16   Prior Treatments self care with silvadene    Pain Assessment 0-10   Pain Score 5   Pt states last night his pain was as high as a 10/10    Pain Type Acute pain   Pain Location Arm   Pain Orientation Right;Distal   Pain Onset On-going   Multiple Pain Sites Yes  back will be addressed in second evaluation next week.    Evaluation and Treatment Procedures Explained to Patient/Family Yes   Evaluation and Treatment Procedures agreed to   Wound Properties Date First Assessed: 09/24/16 Time First Assessed: 1445 Wound Type: Burn Location: Arm Location Orientation: Right;Distal Present on Admission: Yes   Dressing Type Silver dressings   Dressing Changed Changed   Dressing Status Old drainage   Dressing Change Frequency PRN   Site / Wound Assessment Dry;Granulation tissue;Painful;Yellow   % Wound base Red or Granulating 80%   % Wound base Yellow/Fibrinous Exudate 20%   Peri-wound Assessment Intact   Wound Length (cm) 4.5 cm   Wound Width (cm) 0.8 cm   Wound Depth (cm) 0.2 cm   Drainage Amount Scant  Treatment Cleansed;Debridement (Selective)   Selective Debridement - Location --  lateral wound bed    Selective Debridement - Tools Used Forceps;Scalpel   Selective Debridement - Tissue Removed slough   Wound Therapy - Clinical Statement Mr. Kallal has been referred to skilled physical therapy for a second degree burn located on the dorsal aspect of his right wrist.  He will benefit from skilled physical therapy to ensure a positive wound healing environment.    Wound Therapy - Functional Problem List pain limiting use of UE and sleep    Hydrotherapy Plan Debridement;Dressing change;Patient/family education   Wound Therapy - Frequency --  twice a week for four weeks or until wound is healed   Wound Therapy - Current Recommendations PT   Wound Plan See 2x week x 4 weeks for wound care to pt burn on his right UE.    Dressing  changed to xeroform, 2x2 , kling and netting              Objective measurements completed on examination: See above findings.                PT Education - 09/24/16 1547    Education provided Yes   Education Details Keep dressing dry.  Do not change dressing twice a day attempt to wait every other day for dressing change.   Person(s) Educated Patient   Methods Explanation   Comprehension Verbalized understanding          PT Short Term Goals - 09/24/16 1550      PT SHORT TERM GOAL #1   Title Wound to be 100% granulated    Time 2   Period Weeks   Status New     PT SHORT TERM GOAL #2   Title Pain to be no greater than a 4/10 to allow pt to sleep at night    Time 2   Period Weeks   Status New           PT Long Term Goals - 09/24/16 1552      PT LONG TERM GOAL #1   Title Burn size to be no greater than 2.5 x .4x 0 to allow pt to feel confident in self care of his burn.   Time 4   Period Weeks   Status New     PT LONG TERM GOAL #2   Title Pain level to be no greater than a 2/10 to allow pt to return to completing all his normal ADL's    Time 4   Period Weeks              Plan - 09/24/16 1550    Clinical Impression Statement see above    Clinical Presentation Stable   Clinical Decision Making Low   Rehab Potential Good   PT Frequency 2x / week   PT Duration 4 weeks   PT Treatment/Interventions ADLs/Self Care Home Management  debridement and dressing change    PT Next Visit Plan continue with debridement and dressing change   Consulted and Agree with Plan of Care Patient      Patient will benefit from skilled therapeutic intervention in order to improve the following deficits and impairments:     Visit Diagnosis: Second degree burn of right arm, sequela  Pain in right arm    Problem List There are no active problems to display for this patient.   Virgina Organ, PT CLT [phone] 09/24/2016, 3:57 PM  [facility] [address] [place], [place], [address] Phone: [phone]   Fax:  [phone]  Name: Mitchell Martin MRN: [id number] Date of Birth: [birth year]

## 2016-09-24 NOTE — Telephone Encounter (Signed)
2x week for 4 weeks per CR. Pt did not stop to schedule. NF Workers Comp pt will bring information on another day. Tried to call pt phone w/not take messges. Print new schedule Wound NF 09/24/16

## 2016-09-28 ENCOUNTER — Encounter (HOSPITAL_COMMUNITY): Payer: Self-pay | Admitting: Physical Therapy

## 2016-09-28 ENCOUNTER — Ambulatory Visit (HOSPITAL_COMMUNITY): Payer: Self-pay | Admitting: Physical Therapy

## 2016-09-28 DIAGNOSIS — M545 Low back pain, unspecified: Secondary | ICD-10-CM

## 2016-09-28 DIAGNOSIS — R29898 Other symptoms and signs involving the musculoskeletal system: Secondary | ICD-10-CM

## 2016-09-28 DIAGNOSIS — T2220XS Burn of second degree of shoulder and upper limb, except wrist and hand, unspecified site, sequela: Secondary | ICD-10-CM

## 2016-09-28 DIAGNOSIS — R252 Cramp and spasm: Secondary | ICD-10-CM

## 2016-09-28 DIAGNOSIS — M79601 Pain in right arm: Secondary | ICD-10-CM

## 2016-09-28 NOTE — Patient Instructions (Signed)
   SINGLE KNEE TO CHEST STRETCH - SKTC  While lying on your back, use your hands and gently draw up a knee towards your chest.   Keep your other knee straight and lying on the ground.  Hold for 5-10 seconds; repeat 5 times each side, twice a day.    Lumbar Rotations   Lying on your back with your knees bent and stomach muscles tightened, slowly drop your legs to one side and hold the stretch. Come back to the middle and switch sides. You should feel the stretch in your back on the opposite side that your legs are leaning.  Hold for 5-10 seconds then switch sides.   Repeat 5 times each side, twice a day.    MOIST HEAT  Use a moist heat pack (I do not recommend electric blankets or pads). Put 2 layers of towels between your skin and the heat pack.  You may use the heat pack sitting or laying on your back.  Stay in this position for 10-15 minutes.  Repeat 2-3 times per day or as needed if it is helping significantly with your pain.     PRONE ON ELBOWS - POE  Lying face down, slowly press up and prop yourself up on your elbows.  Hold for 10 seconds.  Repeat 5-10 times, twice a day.

## 2016-09-28 NOTE — Therapy (Signed)
[facility] [address] [place], [place], [address] Phone: [phone]   Fax:  [phone]  Physical Therapy Evaluation  Patient Details  Name: Mitchell Martin MRN: [id number] Date of Birth: [birth year] Referring Provider: Laverle Hobby   Encounter Date: 09/28/2016      PT End of Session - 09/28/16 1636    Visit Number 2   Number of Visits 6   Date for PT Re-Evaluation 10/02/16   Authorization Type work comp (# of visits currently limited- see plan section for more details)   Authorization - Visit Number 2   Authorization - Number of Visits 10   PT Start Time 1432   PT Stop Time 1513   PT Time Calculation (min) 41 min   Activity Tolerance Patient tolerated treatment well   Behavior During Therapy [facility] for tasks assessed/performed      Past Medical History:  Diagnosis Date  . Back pain     History reviewed. No pertinent surgical history.  There were no vitals filed for this visit.       Subjective Assessment - 09/28/16 1627    Subjective Patient reports that on June 1st he was pulling a 1000# cart at work and felt pain in his low back; right now sitting for prolong time makes it worse and standing makes it better. His back just feels stiff and tight.    Pertinent History workers comp case    Patient Stated Goals to reduce back pain    Currently in Pain? Yes   Pain Score 8   burn site    Pain Location Hand   Pain Orientation Right   Pain Descriptors / Indicators Discomfort;Burning   Pain Type Acute pain   Pain Radiating Towards none    Pain Onset 1 to 4 weeks ago   Pain Frequency Constant   Aggravating Factors  hitting area    Pain Relieving Factors leaving it alone    Effect of Pain on Daily Activities limits comfort of R UE based activities             [facility] PT Assessment - 09/28/16 0001      Assessment   Medical Diagnosis Rt UE second degree burn/LBP    Referring Provider Laverle Hobby    Onset  Date/Surgical Date 09/11/16   Next MD Visit 10/02/16   Prior Therapy none      Precautions   Precautions None     Restrictions   Weight Bearing Restrictions No     Balance Screen   Has the patient fallen in the past 6 months No   Has the patient had a decrease in activity level because of a fear of falling?  No   Is the patient reluctant to leave their home because of a fear of falling?  No     Home Tourist information centre manager residence     Prior Function   Level of Independence Independent   Vocation Full time employment   Vocation Requirements On feet, works with Lawyer    Leisure working on cars      Observation/Other Assessments   Observations adductors severely tight B; SLR causes back pain B; in general very guarded presentation      AROM   Lumbar Flexion moderate limitation; RFIS worens pain    Lumbar Extension mild limitation/hip compensation; REIS no change    Lumbar - Right Side Bend WFL    Lumbar - Left  Side Bend [facility]      Strength   Right Hip Flexion 5/5   Right Hip Extension 4/5   Right Hip ABduction 5/5   Left Hip Flexion 5/5   Left Hip Extension 4/5   Left Hip ABduction 5/5   Right Knee Flexion 4+/5   Right Knee Extension 5/5   Left Knee Flexion 4+/5   Left Knee Extension 5/5   Right Ankle Dorsiflexion 5/5   Left Ankle Dorsiflexion 5/5     Flexibility   Hamstrings mild limtation    Piriformis severe limitation B      Palpation   Palpation comment severe R paraspinal and glute spasm noted, TTP          Objective measurements completed on examination: See above findings.         Wound Therapy - 09/28/16 1630    Subjective Patient statse he is doing well, he hit his hand by accident earlier so itis more painful than usual    Patient and Family Stated Goals No pain and burn to be healed    Date of Onset 09/11/16   Prior Treatments self care with silvadene    Pain Score 8    Pain Type Acute pain   Pain  Location Hand   Pain Orientation Right   Pain Descriptors / Indicators Burning;Discomfort   Evaluation and Treatment Procedures Explained to Patient/Family Yes   Evaluation and Treatment Procedures agreed to   Wound Properties Date First Assessed: 09/24/16 Time First Assessed: 1445 Wound Type: Burn Location: Arm Location Orientation: Right;Distal Present on Admission: Yes   Dressing Type Impregnated gauze (petrolatum);Gauze (Comment)   Dressing Changed Changed   Dressing Status Clean   Dressing Change Frequency PRN   % Wound base Red or Granulating 85%   % Wound base Yellow/Fibrinous Exudate 15%   Margins Unattached edges (unapproximated)   Closure None   Drainage Amount None   Drainage Description No odor   Treatment Cleansed;Debridement (Selective);Packing (Impregnated strip);Other (Comment)  cleansed, debridment, xeroform/gauze    Selective Debridement - Location wound bed    Selective Debridement - Tools Used Forceps   Selective Debridement - Tissue Removed slough                 PT Education - 09/28/16 1635    Education provided Yes   Education Details POC moving forward. lumbar HEP; wound status/importance of granulation tissue/easy bloodflow    Person(s) Educated Patient   Methods Explanation;Handout;Demonstration   Comprehension Verbalized understanding;Returned demonstration;Need further instruction          PT Short Term Goals - 09/28/16 1736      PT SHORT TERM GOAL #1   Title Wound to be 100% granulated    Time 2   Period Weeks   Status On-going     PT SHORT TERM GOAL #2   Title Pain to be no greater than a 4/10 to allow pt to sleep at night    Time 2   Period Weeks   Status On-going     PT SHORT TERM GOAL #3   Title patient to be independent in correctly and consistently performing appropriate HEP, to be updated PRN    Time 1   Period Days   Status New     PT SHORT TERM GOAL #4   Title Patient to be able to verbalize the importance of  general graded movement in recovery from low back injury, and will also be able to verbalize progression of general  independent graded exercise program   Time 1   Period Weeks   Status New     PT SHORT TERM GOAL #5   Title Patient to demonstrate limitation of lumbar ROM as being no more than 20% and elimination of guarded movement pattern in order to improve general mobility and task tolerance    Time 1   Period Weeks   Status New     Additional Short Term Goals   Additional Short Term Goals Yes     PT SHORT TERM GOAL #6   Title Patient to experience LBP no more than 1/10 in order to improve functional task tolerance and sleep quality    Time 1   Period Weeks   Status New           PT Long Term Goals - 09/28/16 1740      PT LONG TERM GOAL #1   Title Burn size to be no greater than 2.5 x .4x 0 to allow pt to feel confident in self care of his burn.   Time 4   Period Weeks   Status New     PT LONG TERM GOAL #2   Title Pain level to be no greater than a 2/10 to allow pt to return to completing all his normal ADL's    Time 4   Period Weeks   Status New                Plan - 09/28/16 1636    Clinical Impression Statement Lumbar evaluation performed today along with wound care treatment session for hand burn. Objective measures reveal severe spasm especially in R side of lumbar spine and glutes, mild functional weakness, and impaired muscle flexibility however this could also be related to patient's generally guarded pattern. Recommend skilled PT services as allowed by MD's worker comp order to address functional deficits and educate patient. Burn appears to be healing well however does demonstrate some small amounts of slough which were generally debrided today, burn remains very tender however. Discussed MD POC/worker's comp limitations with evaluating PT for wound; at this time patient is to be seen consecutively for the next 2 days, we will attempt to see him for at  least wound if able on Friday as well.   History and Personal Factors relevant to plan of care: clear MOI    Clinical Presentation Stable   Clinical Presentation due to: physical injury/MOI    Rehab Potential Good   PT Frequency Other (comment)  3 back to back sessions per MD order    PT Duration Other (comment)  3 back to back sessions per MD order    PT Treatment/Interventions ADLs/Self Care Home Management;Cryotherapy;Moist Heat;Functional mobility training;Therapeutic activities;Therapeutic exercise;Balance training;Neuromuscular re-education;Patient/family education;Manual techniques;Passive range of motion;Dry needling  debridement and dressing change    PT Next Visit Plan next session (tues) low back only; on Wed, wound care and low back. See when he is seeing MD on Friday to see if he can squeak in wound before that appt.    PT Home Exercise Plan 6/18: SKTC, POE, lumbar rotations, moist heat    Consulted and Agree with Plan of Care Patient      Patient will benefit from skilled therapeutic intervention in order to improve the following deficits and impairments:  Improper body mechanics, Pain, Decreased coordination, Increased muscle spasms, Postural dysfunction, Decreased strength, Decreased range of motion, Hypomobility, Impaired flexibility  Visit Diagnosis: Second degree burn of right arm, sequela - Plan: PT  plan of care cert/re-cert  Pain in right arm - Plan: PT plan of care cert/re-cert  Acute bilateral low back pain without sciatica - Plan: PT plan of care cert/re-cert  Cramp and spasm - Plan: PT plan of care cert/re-cert  Other symptoms and signs involving the musculoskeletal system - Plan: PT plan of care cert/re-cert     Problem List There are no active problems to display for this patient.   Nedra Hai PT, DPT [phone]  [facility] [address] [place], [place], [address] Phone: [phone]   Fax:   [phone]  Name: VALERIAN JEWEL MRN: [id number] Date of Birth: [birth year]

## 2016-09-29 ENCOUNTER — Telehealth (HOSPITAL_COMMUNITY): Payer: Self-pay | Admitting: General Practice

## 2016-09-29 ENCOUNTER — Ambulatory Visit (HOSPITAL_COMMUNITY): Payer: Self-pay | Admitting: Physical Therapy

## 2016-09-29 NOTE — Telephone Encounter (Signed)
09/29/16 pt cx today because he said that his ride fell thru and had no way to get here to appt.

## 2016-09-30 ENCOUNTER — Ambulatory Visit (HOSPITAL_COMMUNITY): Payer: Self-pay | Admitting: Physical Therapy

## 2016-10-01 ENCOUNTER — Ambulatory Visit (HOSPITAL_COMMUNITY): Payer: Self-pay

## 2016-10-01 DIAGNOSIS — T2220XS Burn of second degree of shoulder and upper limb, except wrist and hand, unspecified site, sequela: Secondary | ICD-10-CM

## 2016-10-01 DIAGNOSIS — M545 Low back pain, unspecified: Secondary | ICD-10-CM

## 2016-10-01 DIAGNOSIS — R252 Cramp and spasm: Secondary | ICD-10-CM

## 2016-10-01 DIAGNOSIS — R29898 Other symptoms and signs involving the musculoskeletal system: Secondary | ICD-10-CM

## 2016-10-01 DIAGNOSIS — M79601 Pain in right arm: Secondary | ICD-10-CM

## 2016-10-01 NOTE — Patient Instructions (Signed)
Hamstring Step 3    Left leg in maximal straight leg raise, heel at maximal stretch, straighten knee further by tightening knee cap. Warning: Intense stretch. Stay within tolerance. Hold 30 seconds. Relax knee cap only. Repeat 3 times.  Copyright  VHI. All rights reserved.   
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
## 2016-10-02 ENCOUNTER — Ambulatory Visit (HOSPITAL_COMMUNITY): Payer: Self-pay

## 2016-10-02 ENCOUNTER — Ambulatory Visit (HOSPITAL_COMMUNITY): Payer: BLUE CROSS/BLUE SHIELD

## 2016-10-02 DIAGNOSIS — M545 Low back pain, unspecified: Secondary | ICD-10-CM

## 2016-10-02 DIAGNOSIS — M79601 Pain in right arm: Secondary | ICD-10-CM

## 2016-10-02 DIAGNOSIS — T2220XS Burn of second degree of shoulder and upper limb, except wrist and hand, unspecified site, sequela: Secondary | ICD-10-CM

## 2016-10-02 DIAGNOSIS — R252 Cramp and spasm: Secondary | ICD-10-CM

## 2016-10-02 DIAGNOSIS — R29898 Other symptoms and signs involving the musculoskeletal system: Secondary | ICD-10-CM
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
## 2016-10-02 NOTE — Patient Instructions (Signed)
Hip Extension    Lie on back, legs in air, knees bent. Grasp hands behind one thigh (may use towel for assistnace) and cross other leg over same thigh. Hold 30 seconds. Repeat with other leg held. Repeat 3 times. Do 2 sessions per day.  Copyright  VHI. All rights reserved.

## 2016-10-05 ENCOUNTER — Telehealth (HOSPITAL_COMMUNITY): Payer: Self-pay | Admitting: Physical Therapy

## 2016-10-05 ENCOUNTER — Ambulatory Visit (HOSPITAL_COMMUNITY): Payer: Self-pay | Admitting: Physical Therapy

## 2016-10-05 NOTE — Telephone Encounter (Signed)
Patient did not show for appointment.  Called and spoke to patient who states he did not have a ride today.  Reminded him of tomorrows appointment at 4:45 and he reported he would be at this appointment.  Lurena NidaAmy B Frazier, PTA/CLT [phone]

## 2016-10-06 ENCOUNTER — Ambulatory Visit (HOSPITAL_COMMUNITY): Payer: BLUE CROSS/BLUE SHIELD | Admitting: Physical Therapy

## 2016-10-06 ENCOUNTER — Ambulatory Visit (HOSPITAL_COMMUNITY): Payer: Self-pay | Admitting: Physical Therapy

## 2016-10-06 DIAGNOSIS — M79601 Pain in right arm: Secondary | ICD-10-CM

## 2016-10-06 DIAGNOSIS — T2220XS Burn of second degree of shoulder and upper limb, except wrist and hand, unspecified site, sequela: Secondary | ICD-10-CM
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
## 2017-05-10 ENCOUNTER — Other Ambulatory Visit: Payer: Self-pay

## 2017-05-10 ENCOUNTER — Encounter (HOSPITAL_COMMUNITY): Payer: Self-pay | Admitting: Emergency Medicine

## 2017-05-10 DIAGNOSIS — M25511 Pain in right shoulder: Secondary | ICD-10-CM | POA: Diagnosis not present

## 2017-05-10 DIAGNOSIS — Z5321 Procedure and treatment not carried out due to patient leaving prior to being seen by health care provider: Secondary | ICD-10-CM | POA: Diagnosis not present

## 2017-05-10 DIAGNOSIS — M542 Cervicalgia: Secondary | ICD-10-CM | POA: Insufficient documentation
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
## 2017-05-11 ENCOUNTER — Emergency Department (HOSPITAL_COMMUNITY)
Admission: EM | Admit: 2017-05-11 | Discharge: 2017-05-11 | Disposition: A | Discharge status: Home / Self Care | Payer: BLUE CROSS/BLUE SHIELD | Attending: Emergency Medicine | Admitting: Emergency Medicine

## 2017-05-11 NOTE — ED Notes (Signed)
Called x 1 no answer

## 2017-05-14 ENCOUNTER — Ambulatory Visit (HOSPITAL_COMMUNITY)
Admission: EM | Admit: 2017-05-14 | Discharge: 2017-05-14 | Disposition: A | Discharge status: Home / Self Care | Payer: BLUE CROSS/BLUE SHIELD | Attending: Internal Medicine | Admitting: Internal Medicine

## 2017-05-14 ENCOUNTER — Encounter (HOSPITAL_COMMUNITY): Payer: Self-pay | Admitting: Emergency Medicine

## 2017-05-14 DIAGNOSIS — R519 Headache, unspecified: Secondary | ICD-10-CM

## 2017-05-14 DIAGNOSIS — R51 Headache: Secondary | ICD-10-CM | POA: Diagnosis not present

## 2017-05-14 DIAGNOSIS — M542 Cervicalgia: Secondary | ICD-10-CM | POA: Diagnosis not present

## 2017-05-14 MED ORDER — CYCLOBENZAPRINE HCL 10 MG PO TABS: 5.0000 mg | ORAL_TABLET | Freq: Every evening | ORAL | 0 refills | Status: DC | PRN

## 2017-05-14 MED ORDER — METOCLOPRAMIDE HCL 5 MG/ML IJ SOLN
INTRAMUSCULAR | Status: AC
  Filled 2017-05-14: qty 2

## 2017-05-14 MED ORDER — MELOXICAM 7.5 MG PO TABS: 7.5000 mg | ORAL_TABLET | Freq: Every day | ORAL | 0 refills | Status: DC

## 2017-05-14 MED ORDER — METOCLOPRAMIDE HCL 5 MG/ML IJ SOLN
5.0000 mg | Freq: Once | INTRAMUSCULAR | Status: AC
  Administered 2017-05-14: 5 mg via INTRAMUSCULAR

## 2017-05-14 MED ORDER — KETOROLAC TROMETHAMINE 60 MG/2ML IM SOLN
60.0000 mg | Freq: Once | INTRAMUSCULAR | Status: AC
  Administered 2017-05-14: 60 mg via INTRAMUSCULAR

## 2017-05-14 MED ORDER — DEXAMETHASONE SODIUM PHOSPHATE 10 MG/ML IJ SOLN
10.0000 mg | Freq: Once | INTRAMUSCULAR | Status: AC
  Administered 2017-05-14: 10 mg via INTRAMUSCULAR

## 2017-05-14 MED ORDER — KETOROLAC TROMETHAMINE 60 MG/2ML IM SOLN
INTRAMUSCULAR | Status: AC
  Filled 2017-05-14: qty 2

## 2017-05-14 MED ORDER — ONDANSETRON 4 MG PO TBDP: 4.0000 mg | ORAL_TABLET | Freq: Three times a day (TID) | ORAL | 0 refills | Status: DC | PRN

## 2017-05-14 MED ORDER — DEXAMETHASONE SODIUM PHOSPHATE 10 MG/ML IJ SOLN
INTRAMUSCULAR | Status: AC
  Filled 2017-05-14: qty 1

## 2017-05-14 NOTE — Discharge Instructions (Signed)
Toradol, Decadron, Reglan injection in office today. No alarming signs on your exam. Start mobic for pain. Flexeril as needed at night. Flexeril can make you drowsy, so do not take if you are going to drive, operate heavy machinery, or make important decisions. Ice/heat compresses as needed.  Follow-up with PCP for further evaluation needed.  Back  If experience numbness/tingling of the inner thighs, loss of bladder or bowel control, go to the emergency department for evaluation.   Head If experiencing worsening of symptoms, headache/blurry vision, nausea/vomiting, confusion/altered mental status, dizziness, weakness, passing out, imbalance, go to the emergency department for further evaluation.

## 2017-05-14 NOTE — ED Provider Notes (Signed)
[facility]    CSN: [id number] Arrival date & time: 05/14/17  1553     History   Chief Complaint Chief Complaint  Patient presents with  . Headache    HPI Mitchell Martin is a 27 y.o. male.   27 year old male comes in for one-week history of intermittent headache.  He has had bilateral headache to the base of the occipital area with bilateral neck and shoulder pain.  States pain is worse with movement.  Denies photophobia, phonophobia, confusion, dizziness, weakness, syncope.  States that pain has been so bad he has had a few episodes of vomiting.  He took some BC Powder, Aleve last week, has not had anything recent.  He states that a few months ago he injured his back during work, and thinks that that is traveling up to cause his symptoms.  Denies new injury.  Denies numbness, tingling, loss of bladder or bowel control.      Past Medical History:  Diagnosis Date  . Back pain     There are no active problems to display for this patient.   History reviewed. No pertinent surgical history.     Home Medications    Prior to Admission medications   Medication Sig Start Date End Date Taking? Authorizing Provider  cyclobenzaprine (FLEXERIL) 10 MG tablet Take 0.5-1 tablets (5-10 mg total) by mouth at bedtime as needed for muscle spasms. 05/14/17   Cathie Hoops, Amy V, PA-C  meloxicam (MOBIC) 7.5 MG tablet Take 1 tablet (7.5 mg total) by mouth daily. 05/14/17   Cathie Hoops, Amy V, PA-C  ondansetron (ZOFRAN ODT) 4 MG disintegrating tablet Take 1 tablet (4 mg total) by mouth every 8 (eight) hours as needed for nausea or vomiting. 05/14/17   Belinda Fisher, PA-C    Family History History reviewed. No pertinent family history.  Social History Social History   Tobacco Use  . Smoking status: Former Games developer  . Smokeless tobacco: Never Used  Substance Use Topics  . Alcohol use: No  . Drug use: No     Allergies   Patient has no known allergies.   Review of Systems Review of Systems    Reason unable to perform ROS: See HPI as above.     Physical Exam Triage Vital Signs ED Triage Vitals  Enc Vitals Group     BP 05/14/17 1603 135/73     Pulse Rate 05/14/17 1603 67     Resp 05/14/17 1603 20     Temp 05/14/17 1603 98.1 F (36.7 C)     Temp Source 05/14/17 1603 Oral     SpO2 05/14/17 1603 100 %     Weight --      Height --      Head Circumference --      Peak Flow --      Pain Score 05/14/17 1604 7     Pain Loc --      Pain Edu? --      Excl. in GC? --    No data found.  Updated Vital Signs BP 135/73 (BP Location: Left Arm)   Pulse 67   Temp 98.1 F (36.7 C) (Oral)   Resp 20   SpO2 100%   Physical Exam  Constitutional: He is oriented to person, place, and time. He appears well-developed and well-nourished. No distress.  HENT:  Head: Normocephalic and atraumatic.  Eyes: Conjunctivae, EOM and lids are normal. Pupils are equal, round, and reactive to light.  Neck: Normal range of  motion. Neck supple. Normal range of motion present.  Diffuse tenderness to palpation of the neck.  Cardiovascular: Normal rate, regular rhythm and normal heart sounds. Exam reveals no gallop and no friction rub.  No murmur heard. Pulmonary/Chest: Effort normal and breath sounds normal. No respiratory distress. He has no wheezes. He has no rales.  Musculoskeletal:  Diffuse tenderness to palpation of trapezius muscle.  Full range of motion of shoulder, neck, back.  Strength normal and equal bilaterally.  Sensation intact and equal bilaterally.  Radial pulse 2+ and equal bilateral.  Cap refill less than 2 seconds.  Neurological: He is alert and oriented to person, place, and time.    UC Treatments / Results  Labs (all labs ordered are listed, but only abnormal results are displayed) Labs Reviewed - No data to display  EKG  EKG Interpretation None       Radiology No results found.  Procedures Procedures (including critical care time)  Medications Ordered in  UC Medications  ketorolac (TORADOL) injection 60 mg (60 mg Intramuscular Given 05/14/17 1713)  metoCLOPramide (REGLAN) injection 5 mg (5 mg Intramuscular Given 05/14/17 1713)  dexamethasone (DECADRON) injection 10 mg (10 mg Intramuscular Given 05/14/17 1715)     Initial Impression / Assessment and Plan / UC Course  I have reviewed the triage vital signs and the nursing notes.  Pertinent labs & imaging results that were available during my care of the patient were reviewed by me and considered in my medical decision making (see chart for details).    Discussed with patient no alarming signs on exam.  Toradol, Decadron, Reglan injection in office today. Start NSAID as directed for pain and inflammation. Muscle relaxant as needed. Ice/heat compresses. Return precautions given.  Patient expresses understanding and agrees to plan.   Final Clinical Impressions(s) / UC Diagnoses   Final diagnoses:  Acute intractable headache, unspecified headache type    ED Discharge Orders        Ordered    meloxicam (MOBIC) 7.5 MG tablet  Daily     05/14/17 1707    cyclobenzaprine (FLEXERIL) 10 MG tablet  At bedtime PRN     05/14/17 1707    ondansetron (ZOFRAN ODT) 4 MG disintegrating tablet  Every 8 hours PRN     05/14/17 1707        Belinda FisherYu, Amy V, PA-C 05/14/17 1800

## 2017-05-14 NOTE — ED Triage Notes (Signed)
PT C/O: intermittent HA onset 1 week.... Went to [facility] but left due to wait time  Reports pain is due to an old inj.... Sx also include right shoulder and neck pain and vomiting  TAKING MEDS: none  A&O x4... NAD... Ambulatory

## 2017-05-14 NOTE — ED Notes (Signed)
Called listed phone number for a follow-up call.  Message left for pt. To return call if they have any questions or concerns. 05/14/2017,  13:55

## 2017-07-01 ENCOUNTER — Encounter (HOSPITAL_COMMUNITY): Payer: Self-pay | Admitting: Emergency Medicine

## 2017-07-01 ENCOUNTER — Emergency Department (HOSPITAL_COMMUNITY)
Admission: EM | Admit: 2017-07-01 | Discharge: 2017-07-01 | Disposition: A | Discharge status: Home / Self Care | Payer: BLUE CROSS/BLUE SHIELD | Attending: Emergency Medicine | Admitting: Emergency Medicine

## 2017-07-01 ENCOUNTER — Other Ambulatory Visit: Payer: Self-pay

## 2017-07-01 DIAGNOSIS — Z5321 Procedure and treatment not carried out due to patient leaving prior to being seen by health care provider: Secondary | ICD-10-CM | POA: Diagnosis not present

## 2017-07-01 DIAGNOSIS — M545 Low back pain: Secondary | ICD-10-CM | POA: Diagnosis present

## 2017-07-01 NOTE — ED Triage Notes (Signed)
Pt c/o of back pain in lower back starting yesterday. States having chronic back pain.

## 2017-07-02 NOTE — ED Notes (Signed)
07/02/2017, Attempted follow-up call, no answer. 

## 2017-10-25 ENCOUNTER — Encounter (HOSPITAL_COMMUNITY): Payer: Self-pay | Admitting: *Deleted

## 2017-10-25 ENCOUNTER — Emergency Department (HOSPITAL_COMMUNITY): Payer: BLUE CROSS/BLUE SHIELD

## 2017-10-25 ENCOUNTER — Emergency Department (HOSPITAL_COMMUNITY)
Admission: EM | Admit: 2017-10-25 | Discharge: 2017-10-25 | Disposition: A | Discharge status: Home / Self Care | Payer: BLUE CROSS/BLUE SHIELD | Attending: Emergency Medicine | Admitting: Emergency Medicine

## 2017-10-25 ENCOUNTER — Other Ambulatory Visit: Payer: Self-pay

## 2017-10-25 DIAGNOSIS — N201 Calculus of ureter: Secondary | ICD-10-CM | POA: Insufficient documentation

## 2017-10-25 DIAGNOSIS — Z87891 Personal history of nicotine dependence: Secondary | ICD-10-CM | POA: Insufficient documentation

## 2017-10-25 DIAGNOSIS — R1032 Left lower quadrant pain: Secondary | ICD-10-CM | POA: Diagnosis present

## 2017-10-25 DIAGNOSIS — Z79899 Other long term (current) drug therapy: Secondary | ICD-10-CM | POA: Diagnosis not present

## 2017-10-25 LAB — CBC WITH DIFFERENTIAL/PLATELET
BASOS ABS: 0 10*3/uL (ref 0.0–0.1)
BASOS PCT: 0 %
EOS PCT: 1 %
Eosinophils Absolute: 0.1 10*3/uL (ref 0.0–0.7)
HCT: 39 % (ref 39.0–52.0)
Hemoglobin: 12.8 g/dL — ABNORMAL LOW (ref 13.0–17.0)
Lymphocytes Relative: 54 %
Lymphs Abs: 5.6 10*3/uL — ABNORMAL HIGH (ref 0.7–4.0)
MCH: 24.2 pg — ABNORMAL LOW (ref 26.0–34.0)
MCHC: 32.8 g/dL (ref 30.0–36.0)
MCV: 73.9 fL — AB (ref 78.0–100.0)
MONO ABS: 1 10*3/uL (ref 0.1–1.0)
Monocytes Relative: 9 %
Neutro Abs: 3.8 10*3/uL (ref 1.7–7.7)
Neutrophils Relative %: 36 %
PLATELETS: 275 10*3/uL (ref 150–400)
RBC: 5.28 MIL/uL (ref 4.22–5.81)
RDW: 14.5 % (ref 11.5–15.5)
WBC: 10.5 10*3/uL (ref 4.0–10.5)

## 2017-10-25 LAB — URINALYSIS, ROUTINE W REFLEX MICROSCOPIC
Bacteria, UA: NONE SEEN
Bilirubin Urine: NEGATIVE
Glucose, UA: NEGATIVE mg/dL
Ketones, ur: 5 mg/dL — AB
NITRITE: NEGATIVE
PH: 6 (ref 5.0–8.0)
Protein, ur: 30 mg/dL — AB
Specific Gravity, Urine: 1.014 (ref 1.005–1.030)

## 2017-10-25 LAB — COMPREHENSIVE METABOLIC PANEL
ALK PHOS: 54 U/L (ref 38–126)
ALT: 21 U/L (ref 0–44)
ANION GAP: 11 (ref 5–15)
AST: 28 U/L (ref 15–41)
Albumin: 4.9 g/dL (ref 3.5–5.0)
BILIRUBIN TOTAL: 0.8 mg/dL (ref 0.3–1.2)
BUN: 9 mg/dL (ref 6–20)
CALCIUM: 9.6 mg/dL (ref 8.9–10.3)
CO2: 25 mmol/L (ref 22–32)
Chloride: 104 mmol/L (ref 98–111)
Creatinine, Ser: 1.17 mg/dL (ref 0.61–1.24)
GFR calc Af Amer: >60 mL/min (ref >60)
Glucose, Bld: 126 mg/dL — ABNORMAL HIGH (ref 70–99)
POTASSIUM: 3.2 mmol/L — AB (ref 3.5–5.1)
Sodium: 140 mmol/L (ref 135–145)
TOTAL PROTEIN: 8.2 g/dL — AB (ref 6.5–8.1)

## 2017-10-25 MED ORDER — ONDANSETRON HCL 4 MG/2ML IJ SOLN
4.0000 mg | Freq: Once | INTRAMUSCULAR | Status: AC
  Administered 2017-10-25: 4 mg via INTRAVENOUS
  Filled 2017-10-25: qty 2

## 2017-10-25 MED ORDER — SODIUM CHLORIDE 0.9 % IV BOLUS
1000.0000 mL | Freq: Once | INTRAVENOUS | Status: AC
  Administered 2017-10-25: 1000 mL via INTRAVENOUS

## 2017-10-25 MED ORDER — MORPHINE SULFATE (PF) 4 MG/ML IV SOLN
4.0000 mg | Freq: Once | INTRAVENOUS | Status: AC
  Administered 2017-10-25: 4 mg via INTRAVENOUS
  Filled 2017-10-25: qty 1

## 2017-10-25 MED ORDER — ONDANSETRON HCL 4 MG PO TABS: 4.0000 mg | ORAL_TABLET | Freq: Four times a day (QID) | ORAL | 0 refills | Status: DC | PRN

## 2017-10-25 MED ORDER — OXYCODONE-ACETAMINOPHEN 5-325 MG PO TABS: 1.0000 | ORAL_TABLET | ORAL | 0 refills | Status: DC | PRN

## 2017-10-25 MED ORDER — TAMSULOSIN HCL 0.4 MG PO CAPS: 0.4000 mg | ORAL_CAPSULE | Freq: Every day | ORAL | 0 refills | Status: DC

## 2017-10-25 NOTE — ED Triage Notes (Signed)
Pt c/o left side abdominal pain for the last couple of days with the pain radiating around to his back tonight; pt states he has vomited x 1

## 2017-10-25 NOTE — ED Provider Notes (Signed)
[facility] EMERGENCY DEPARTMENT Provider Note   CSN: [id number] Arrival date & time: 10/25/17  4098     History   Chief Complaint Chief Complaint  Patient presents with  . Abdominal Pain    HPI Mitchell Martin is a 27 y.o. male.  The history is provided by the patient.  He had pain in the left lower abdomen starting 3 days ago, and denied pain started radiating to the left flank.  Pain is severe and he rates it at 10/10.  He denies fever but has had chills and sweats.  He denies nausea or vomiting.  He denies any difficulty urinating.  He denies constipation or diarrhea.  He has been taking acetaminophen and ibuprofen for pain without relief.  Nothing makes the pain better, nothing makes it worse.  He has not had pain like this previously.  Past Medical History:  Diagnosis Date  . Back pain     There are no active problems to display for this patient.   History reviewed. No pertinent surgical history.      Home Medications    Prior to Admission medications   Medication Sig Start Date End Date Taking? Authorizing Provider  cyclobenzaprine (FLEXERIL) 10 MG tablet Take 0.5-1 tablets (5-10 mg total) by mouth at bedtime as needed for muscle spasms. 05/14/17   Cathie Hoops, Amy V, PA-C  meloxicam (MOBIC) 7.5 MG tablet Take 1 tablet (7.5 mg total) by mouth daily. 05/14/17   Cathie Hoops, Amy V, PA-C  ondansetron (ZOFRAN ODT) 4 MG disintegrating tablet Take 1 tablet (4 mg total) by mouth every 8 (eight) hours as needed for nausea or vomiting. 05/14/17   Belinda Fisher, PA-C    Family History History reviewed. No pertinent family history.  Social History Social History   Tobacco Use  . Smoking status: Former Games developer  . Smokeless tobacco: Never Used  Substance Use Topics  . Alcohol use: No  . Drug use: No     Allergies   Patient has no known allergies.   Review of Systems Review of Systems  All other systems reviewed and are negative.    Physical Exam Updated Vital Signs BP (!) 143/78  (BP Location: Left Arm)   Pulse 90   Temp 98 F (36.7 C) (Oral)   Resp 20   Ht 5\' 11"  (1.803 m)   Wt 83.9 kg (185 lb)   SpO2 97%   BMI 25.80 kg/m   Physical Exam  Nursing note and vitals reviewed.  27 year old male, resting comfortably and in no acute distress. Vital signs are significant for mildly elevated systolic blood pressure. Oxygen saturation is 97%, which is normal. Head is normocephalic and atraumatic. PERRLA, EOMI. Oropharynx is clear. Neck is nontender and supple without adenopathy or JVD. Back is nontender and there is no CVA tenderness. Lungs are clear without rales, wheezes, or rhonchi. Chest is nontender. Heart has regular rate and rhythm without murmur. Abdomen is soft, flat, with mild left lower quadrant tenderness.  There is no rebound or guarding.  There are no masses or hepatosplenomegaly and peristalsis is hypoactive. Extremities have no cyanosis or edema, full range of motion is present. Skin is warm and dry without rash. Neurologic: Mental status is normal, cranial nerves are intact, there are no motor or sensory deficits.  ED Treatments / Results  Labs (all labs ordered are listed, but only abnormal results are displayed) Labs Reviewed  COMPREHENSIVE METABOLIC PANEL - Abnormal; Notable for the following components:  Result Value   Potassium 3.2 (*)    Glucose, Bld 126 (*)    Total Protein 8.2 (*)    All other components within normal limits  CBC WITH DIFFERENTIAL/PLATELET - Abnormal; Notable for the following components:   Hemoglobin 12.8 (*)    MCV 73.9 (*)    MCH 24.2 (*)    Lymphs Abs 5.6 (*)    All other components within normal limits  URINALYSIS, ROUTINE W REFLEX MICROSCOPIC - Abnormal; Notable for the following components:   APPearance CLOUDY (*)    Hgb urine dipstick LARGE (*)    Ketones, ur 5 (*)    Protein, ur 30 (*)    Leukocytes, UA SMALL (*)    RBC / HPF >50 (*)    All other components within normal limits   Radiology Ct  Renal Stone Study  Result Date: 10/25/2017 CLINICAL DATA:  27 year old male with left flank pain. EXAM: CT ABDOMEN AND PELVIS WITHOUT CONTRAST TECHNIQUE: Multidetector CT imaging of the abdomen and pelvis was performed following the standard protocol without IV contrast. COMPARISON:  None. FINDINGS: Evaluation of this exam is limited in the absence of intravenous contrast. Lower chest: The visualized lung bases are clear. No intra-abdominal free air or free fluid. Hepatobiliary: No focal liver abnormality is seen. No gallstones, gallbladder wall thickening, or biliary dilatation. Pancreas: Unremarkable. No pancreatic ductal dilatation or surrounding inflammatory changes. Spleen: Normal in size without focal abnormality. Adrenals/Urinary Tract: The adrenal glands are unremarkable. There is a 7 mm stone in the mid left ureter with mild left hydronephrosis. Multiple additional nonobstructing bilateral renal calculi noted measuring up to 7 mm in the inferior pole of the right kidney. There is no hydronephrosis on the right. The urinary bladder is predominantly collapsed. Stomach/Bowel: There is no bowel obstruction no active inflammation. The colon is primarily collapsed. There is loose stool in the proximal colon. The appendix is unremarkable. Vascular/Lymphatic: The abdominal aorta and IVC are grossly unremarkable on this noncontrast CT. No portal venous gas. There is no adenopathy. Reproductive: The prostate and seminal vesicles are grossly unremarkable. No pelvic mass. Other: None Musculoskeletal: No acute or significant osseous findings. IMPRESSION: A 7 mm left mid ureteral calculus with mild left hydronephrosis. Additional nonobstructing bilateral renal calculi. No hydronephrosis on the right. Electronically Signed   By: Elgie CollardArash  Radparvar M.D.   On: 10/25/2017 04:35    Procedures Procedures   Medications Ordered in ED Medications  sodium chloride 0.9 % bolus 1,000 mL (1,000 mLs Intravenous New Bag/Given  10/25/17 0350)  morphine 4 MG/ML injection 4 mg (4 mg Intravenous Given 10/25/17 0353)  ondansetron (ZOFRAN) injection 4 mg (4 mg Intravenous Given 10/25/17 0440)  morphine 4 MG/ML injection 4 mg (4 mg Intravenous Given 10/25/17 0442)     Initial Impression / Assessment and Plan / ED Course  I have reviewed the triage vital signs and the nursing notes.  Pertinent labs & imaging results that were available during my care of the patient were reviewed by me and considered in my medical decision making (see chart for details).  Left lower quadrant and left flank pain suspicious for renal calculus.  Possible diverticulitis, urinary tract infection, pyelonephritis.  Old records are reviewed, and he has no relevant past visits, no prior abdominal imaging.  He will be sent for renal stone protocol CT scan.  He is being given IV fluids and morphine.  Screening labs are obtained.  He had good relief of pain with above-noted treatment, but did require  a second injection of morphine.  CT shows a 7 mm calculus in the left mid ureter with hydronephrosis.  Remainder of labs were unremarkable.  This stone is likely to need lithotripsy for definitive care.  He is discharged with prescriptions for ondansetron, oxycodone-acetaminophen, and tamsulosin and is referred to urology for follow-up.  Final Clinical Impressions(s) / ED Diagnoses   Final diagnoses:  Ureterolithiasis    ED Discharge Orders        Ordered    oxyCODONE-acetaminophen (PERCOCET) 5-325 MG tablet  Every 4 hours PRN     10/25/17 0601    oxyCODONE-acetaminophen (PERCOCET) 5-325 MG tablet  Every 4 hours PRN     10/25/17 0602    ondansetron (ZOFRAN) 4 MG tablet  Every 6 hours PRN     10/25/17 0602    tamsulosin (FLOMAX) 0.4 MG CAPS capsule  Daily     10/25/17 0602       Dione Booze, MD 10/25/17 [phone]

## 2017-10-25 NOTE — ED Notes (Signed)
ED Provider at bedside. 

## 2017-10-25 NOTE — Discharge Instructions (Signed)
Return if pain is not being adequately controlled, or if you start running a fever. 

## 2017-10-26 MED FILL — Oxycodone w/ Acetaminophen Tab 5-325 MG: ORAL | Qty: 6 | Status: AC

## 2017-11-06 ENCOUNTER — Emergency Department (HOSPITAL_COMMUNITY): Payer: BLUE CROSS/BLUE SHIELD

## 2017-11-06 ENCOUNTER — Emergency Department (HOSPITAL_COMMUNITY)
Admission: EM | Admit: 2017-11-06 | Discharge: 2017-11-06 | Disposition: A | Discharge status: Home / Self Care | Payer: BLUE CROSS/BLUE SHIELD | Attending: Emergency Medicine | Admitting: Emergency Medicine

## 2017-11-06 ENCOUNTER — Encounter (HOSPITAL_COMMUNITY): Payer: Self-pay | Admitting: Emergency Medicine

## 2017-11-06 DIAGNOSIS — R1084 Generalized abdominal pain: Secondary | ICD-10-CM | POA: Insufficient documentation

## 2017-11-06 DIAGNOSIS — Z87891 Personal history of nicotine dependence: Secondary | ICD-10-CM | POA: Insufficient documentation

## 2017-11-06 DIAGNOSIS — N201 Calculus of ureter: Secondary | ICD-10-CM

## 2017-11-06 DIAGNOSIS — Z87442 Personal history of urinary calculi: Secondary | ICD-10-CM | POA: Insufficient documentation

## 2017-11-06 DIAGNOSIS — N23 Unspecified renal colic: Secondary | ICD-10-CM

## 2017-11-06 DIAGNOSIS — R109 Unspecified abdominal pain: Secondary | ICD-10-CM

## 2017-11-06 HISTORY — DX: Calculus of kidney: N20.0

## 2017-11-06 LAB — COMPREHENSIVE METABOLIC PANEL
ALT: 25 U/L (ref 0–44)
ANION GAP: 11 (ref 5–15)
AST: 23 U/L (ref 15–41)
Albumin: 4.9 g/dL (ref 3.5–5.0)
Alkaline Phosphatase: 55 U/L (ref 38–126)
BUN: 18 mg/dL (ref 6–20)
CHLORIDE: 101 mmol/L (ref 98–111)
CO2: 23 mmol/L (ref 22–32)
Calcium: 9.6 mg/dL (ref 8.9–10.3)
Creatinine, Ser: 1.64 mg/dL — ABNORMAL HIGH (ref 0.61–1.24)
GFR, EST NON AFRICAN AMERICAN: 56 mL/min — AB (ref >60)
Glucose, Bld: 141 mg/dL — ABNORMAL HIGH (ref 70–99)
POTASSIUM: 3.7 mmol/L (ref 3.5–5.1)
Sodium: 135 mmol/L (ref 135–145)
Total Bilirubin: 0.7 mg/dL (ref 0.3–1.2)
Total Protein: 8.2 g/dL — ABNORMAL HIGH (ref 6.5–8.1)

## 2017-11-06 LAB — CBC WITH DIFFERENTIAL/PLATELET
Basophils Absolute: 0 10*3/uL (ref 0.0–0.1)
Basophils Relative: 0 %
Eosinophils Absolute: 0 10*3/uL (ref 0.0–0.7)
Eosinophils Relative: 0 %
HCT: 38.9 % — ABNORMAL LOW (ref 39.0–52.0)
Hemoglobin: 12.8 g/dL — ABNORMAL LOW (ref 13.0–17.0)
LYMPHS ABS: 1 10*3/uL (ref 0.7–4.0)
LYMPHS PCT: 9 %
MCH: 24.4 pg — AB (ref 26.0–34.0)
MCHC: 32.9 g/dL (ref 30.0–36.0)
MCV: 74.1 fL — AB (ref 78.0–100.0)
Monocytes Absolute: 0.8 10*3/uL (ref 0.1–1.0)
Monocytes Relative: 7 %
NEUTROS ABS: 8.8 10*3/uL — AB (ref 1.7–7.7)
NEUTROS PCT: 84 %
Platelets: 289 10*3/uL (ref 150–400)
RBC: 5.25 MIL/uL (ref 4.22–5.81)
RDW: 14.6 % (ref 11.5–15.5)
WBC: 10.6 10*3/uL — AB (ref 4.0–10.5)

## 2017-11-06 LAB — URINALYSIS, ROUTINE W REFLEX MICROSCOPIC
BILIRUBIN URINE: NEGATIVE
Bacteria, UA: NONE SEEN
GLUCOSE, UA: NEGATIVE mg/dL
KETONES UR: 20 mg/dL — AB
Nitrite: NEGATIVE
Protein, ur: NEGATIVE mg/dL
Specific Gravity, Urine: 1.025 (ref 1.005–1.030)
pH: 5 (ref 5.0–8.0)

## 2017-11-06 MED ORDER — ONDANSETRON 4 MG PO TBDP: 4.0000 mg | ORAL_TABLET | Freq: Three times a day (TID) | ORAL | 0 refills | Status: DC | PRN

## 2017-11-06 MED ORDER — ONDANSETRON 8 MG PO TBDP
8.0000 mg | ORAL_TABLET | Freq: Once | ORAL | Status: AC
  Administered 2017-11-06: 8 mg via ORAL
  Filled 2017-11-06: qty 1

## 2017-11-06 MED ORDER — TAMSULOSIN HCL 0.4 MG PO CAPS: 0.4000 mg | ORAL_CAPSULE | Freq: Every day | ORAL | 0 refills | Status: DC

## 2017-11-06 MED ORDER — HYDROMORPHONE HCL 1 MG/ML IJ SOLN
1.0000 mg | Freq: Once | INTRAMUSCULAR | Status: AC
  Administered 2017-11-06: 1 mg via INTRAMUSCULAR
  Filled 2017-11-06: qty 1

## 2017-11-06 MED ORDER — OXYCODONE-ACETAMINOPHEN 5-325 MG PO TABS: ORAL_TABLET | ORAL | 0 refills | Status: DC

## 2017-11-06 NOTE — ED Triage Notes (Signed)
Pt reports left sided flank pain.  Was seen last week for kidney stone and has not passed yet (7mm). Began again last night with vomiting.

## 2017-11-06 NOTE — ED Notes (Signed)
Pt informed a urine sample is needed. RN approved pt to have water at this time.

## 2017-11-06 NOTE — ED Provider Notes (Signed)
[facility] EMERGENCY DEPARTMENT Provider Note   CSN: [id number] Arrival date & time: 11/06/17  4098     History   Chief Complaint Chief Complaint  Patient presents with  . Flank Pain    HPI Mitchell Martin is a 27 y.o. male.   Flank Pain   Pt was seen at 0755. Per pt, c/o sudden onset and persistence of waxing and waning left sided flank "pain" that began 2 weeks ago, worse since yesterday. Pt was evaluated in the ED last week for his symptoms, dx kidney stone, rx pain meds and instructed to f/u with Uro MD. Pt states he was feeling better until yesterday, when his symptoms worsened. Pt describes the pain as "sharp," and radiating into the left side of his abd.  Has been associated with multiple intermittent episodes of N/V since yesterday.  Denies testicular pain/swelling, no dysuria/hematuria, no abd pain, no diarrhea, no black or blood in emesis, no CP/SOB, no fevers, no rash.      Past Medical History:  Diagnosis Date  . Back pain   . Kidney stone     There are no active problems to display for this patient.   History reviewed. No pertinent surgical history.      Home Medications    Prior to Admission medications   Medication Sig Start Date End Date Taking? Authorizing Provider  cyclobenzaprine (FLEXERIL) 10 MG tablet Take 0.5-1 tablets (5-10 mg total) by mouth at bedtime as needed for muscle spasms. 05/14/17   Cathie Hoops, Amy V, PA-C  meloxicam (MOBIC) 7.5 MG tablet Take 1 tablet (7.5 mg total) by mouth daily. 05/14/17   Cathie Hoops, Amy V, PA-C  ondansetron (ZOFRAN ODT) 4 MG disintegrating tablet Take 1 tablet (4 mg total) by mouth every 8 (eight) hours as needed for nausea or vomiting. 05/14/17   Cathie Hoops, Amy V, PA-C  ondansetron (ZOFRAN) 4 MG tablet Take 1 tablet (4 mg total) by mouth every 6 (six) hours as needed. 10/25/17   Dione Booze, MD  oxyCODONE-acetaminophen (PERCOCET) 5-325 MG tablet Take 1 tablet by mouth every 4 (four) hours as needed for moderate pain. 10/25/17   Dione Booze, MD  oxyCODONE-acetaminophen (PERCOCET) 5-325 MG tablet Take 1 tablet by mouth every 4 (four) hours as needed for moderate pain. 10/25/17   Dione Booze, MD  tamsulosin (FLOMAX) 0.4 MG CAPS capsule Take 1 capsule (0.4 mg total) by mouth daily. 10/25/17   Dione Booze, MD    Family History History reviewed. No pertinent family history.  Social History Social History   Tobacco Use  . Smoking status: Former Games developer  . Smokeless tobacco: Never Used  Substance Use Topics  . Alcohol use: No  . Drug use: No     Allergies   Patient has no known allergies.   Review of Systems Review of Systems  Genitourinary: Positive for flank pain.  ROS: Statement: All systems negative except as marked or noted in the HPI; Constitutional: Negative for fever and chills. ; ; Eyes: Negative for eye pain, redness and discharge. ; ; ENMT: Negative for ear pain, hoarseness, nasal congestion, sinus pressure and sore throat. ; ; Cardiovascular: Negative for chest pain, palpitations, diaphoresis, dyspnea and peripheral edema. ; ; Respiratory: Negative for cough, wheezing and stridor. ; ; Gastrointestinal: +N/V. Negative for diarrhea, abdominal pain, blood in stool, hematemesis, jaundice and rectal bleeding. . ; ; Genitourinary: +flank pain. Negative for dysuria and hematuria. ; ; Genital:  No penile drainage or rash, no testicular pain or swelling,  no scrotal rash or swelling. ;; Musculoskeletal: Negative for neck pain. Negative for swelling and trauma.; ; Skin: Negative for pruritus, rash, abrasions, blisters, bruising and skin lesion.; ; Neuro: Negative for headache, lightheadedness and neck stiffness. Negative for weakness, altered level of consciousness, altered mental status, extremity weakness, paresthesias, involuntary movement, seizure and syncope.        Physical Exam Updated Vital Signs BP (!) 144/82 (BP Location: Right Arm)   Pulse 68   Temp 97.9 F (36.6 C) (Oral)   Resp 18   Ht 5\' 11"  (1.803  m)   Wt 83.9 kg (185 lb)   SpO2 100%   BMI 25.80 kg/m   Physical Exam 0800: Physical examination:  Nursing notes reviewed; Vital signs and O2 SAT reviewed;  Constitutional: Well developed, Well nourished, Well hydrated, Uncomfortable appearing.; Head:  Normocephalic, atraumatic; Eyes: EOMI, PERRL, No scleral icterus; ENMT: Mouth and pharynx normal, Mucous membranes moist; Neck: Supple, Full range of motion, No lymphadenopathy; Cardiovascular: Regular rate and rhythm, No gallop; Respiratory: Breath sounds clear & equal bilaterally, No wheezes.  Speaking full sentences with ease, Normal respiratory effort/excursion; Chest: Nontender, Movement normal; Abdomen: Soft, Nontender, Nondistended, Normal bowel sounds; Genitourinary: No CVA tenderness; Spine:  No midline CS, TS, LS tenderness. +TTP left lumbar paraspinal muscles.;; Extremities: Peripheral pulses normal, No tenderness, No edema, No calf edema or asymmetry.; Neuro: AA&Ox3, Major CN grossly intact.  Speech clear. No gross focal motor or sensory deficits in extremities.; Skin: Color normal, Warm, Dry.   ED Treatments / Results  Labs (all labs ordered are listed, but only abnormal results are displayed)   EKG None  Radiology   Procedures Procedures (including critical care time)  Medications Ordered in ED Medications  ondansetron (ZOFRAN-ODT) disintegrating tablet 8 mg (8 mg Oral Given 11/06/17 0753)  HYDROmorphone (DILAUDID) injection 1 mg (1 mg Intramuscular Given 11/06/17 0753)     Initial Impression / Assessment and Plan / ED Course  I have reviewed the triage vital signs and the nursing notes.  Pertinent labs & imaging results that were available during my care of the patient were reviewed by me and considered in my medical decision making (see chart for details).  MDM Reviewed: previous chart, nursing note and vitals Reviewed previous: CT scan and labs Interpretation: labs and CT scan   Results for orders placed or  performed during the hospital encounter of 11/06/17  Comprehensive metabolic panel  Result Value Ref Range   Sodium 135 135 - 145 mmol/L   Potassium 3.7 3.5 - 5.1 mmol/L   Chloride 101 98 - 111 mmol/L   CO2 23 22 - 32 mmol/L   Glucose, Bld 141 (H) 70 - 99 mg/dL   BUN 18 6 - 20 mg/dL   Creatinine, Ser 4.09 (H) 0.61 - 1.24 mg/dL   Calcium 9.6 8.9 - 81.1 mg/dL   Total Protein 8.2 (H) 6.5 - 8.1 g/dL   Albumin 4.9 3.5 - 5.0 g/dL   AST 23 15 - 41 U/L   ALT 25 0 - 44 U/L   Alkaline Phosphatase 55 38 - 126 U/L   Total Bilirubin 0.7 0.3 - 1.2 mg/dL   GFR calc non Af Amer 56 (L) >60 mL/min   GFR calc Af Amer >60 >60 mL/min   Anion gap 11 5 - 15  CBC with Differential  Result Value Ref Range   WBC 10.6 (H) 4.0 - 10.5 K/uL   RBC 5.25 4.22 - 5.81 MIL/uL   Hemoglobin 12.8 (L) 13.0 -  17.0 g/dL   HCT 09.8 (L) 11.9 - 14.7 %   MCV 74.1 (L) 78.0 - 100.0 fL   MCH 24.4 (L) 26.0 - 34.0 pg   MCHC 32.9 30.0 - 36.0 g/dL   RDW 82.9 56.2 - 13.0 %   Platelets 289 150 - 400 K/uL   Neutrophils Relative % 84 %   Neutro Abs 8.8 (H) 1.7 - 7.7 K/uL   Lymphocytes Relative 9 %   Lymphs Abs 1.0 0.7 - 4.0 K/uL   Monocytes Relative 7 %   Monocytes Absolute 0.8 0.1 - 1.0 K/uL   Eosinophils Relative 0 %   Eosinophils Absolute 0.0 0.0 - 0.7 K/uL   Basophils Relative 0 %   Basophils Absolute 0.0 0.0 - 0.1 K/uL  Urinalysis, Routine w reflex microscopic  Result Value Ref Range   Color, Urine YELLOW YELLOW   APPearance CLEAR CLEAR   Specific Gravity, Urine 1.025 1.005 - 1.030   pH 5.0 5.0 - 8.0   Glucose, UA NEGATIVE NEGATIVE mg/dL   Hgb urine dipstick SMALL (A) NEGATIVE   Bilirubin Urine NEGATIVE NEGATIVE   Ketones, ur 20 (A) NEGATIVE mg/dL   Protein, ur NEGATIVE NEGATIVE mg/dL   Nitrite NEGATIVE NEGATIVE   Leukocytes, UA SMALL (A) NEGATIVE   RBC / HPF 11-20 0 - 5 RBC/hpf   WBC, UA 11-20 0 - 5 WBC/hpf   Bacteria, UA NONE SEEN NONE SEEN   Mucus PRESENT    Ca Oxalate Crys, UA PRESENT    Ct Renal Stone  Study Result Date: 11/06/2017 CLINICAL DATA:  Known kidney stone.  Left flank pain. EXAM: CT ABDOMEN AND PELVIS WITHOUT CONTRAST TECHNIQUE: Multidetector CT imaging of the abdomen and pelvis was performed following the standard protocol without IV contrast. COMPARISON:  CT of the abdomen and pelvis 10/25/2017. FINDINGS: Lower chest: Lung bases are clear without focal nodule, mass, or airspace disease. The heart size is normal. Hepatobiliary: No focal liver abnormality is seen. No gallstones, gallbladder wall thickening, or biliary dilatation. Pancreas: Unremarkable. No pancreatic ductal dilatation or surrounding inflammatory changes. Spleen: Normal in size without focal abnormality. Adrenals/Urinary Tract: Adrenal glands are normal bilaterally. Previously noted left ureteral stone has migrated into the distal left ureter, just above the UVJ. There is dilation of the ureter throughout its course. Moderate left-sided hydronephrosis has increased. Two additional nonobstructing stones at the lower pole of the left kidney are stable. Multiple nonobstructing stones at the lower pole of the right kidney are stable. There is no right ureteral stone or obstruction. The urinary bladder is mostly collapsed. Stomach/Bowel: Stomach and duodenum are within normal limits. Small bowel is unremarkable. Terminal ileum is normal. The appendix is visualized and normal. The ascending and transverse colon are normal. Descending and sigmoid colon are normal. Vascular/Lymphatic: No significant vascular findings are present. No enlarged abdominal or pelvic lymph nodes. Reproductive: Prostate is unremarkable. Other: No abdominal wall hernia or abnormality. No abdominopelvic ascites. Musculoskeletal: Vertebral body heights and alignment are maintained. No focal lytic or blastic lesions are present. Pelvis is within normal limits. Hips are located and normal. IMPRESSION: 1. Migration of left ureteral stone to just above the UVJ. 2.  Progressive ureteral dilation and now moderate left-sided hydronephrosis. 3. Additional nonobstructing stones at the lower pole of both kidneys. Electronically Signed   By: Marin Roberts M.D.   On: 11/06/2017 08:49    1100:  BUN/Cr elevated from previous. No UTI on Udip. CT with stone migration to UVJ. Pt feels better after meds/nausea meds  and wants to go home now. Has tol PO well without N/V. Offered admission, pt refuses, stating he feels better. T/C returned from Uro Dr. Mena GoesEskridge, case discussed, including:  HPI, pertinent PM/SHx, VS/PE, dx testing, ED course and treatment:  Agrees OK to d/c home, rx pain/nausea/flomax, f/u office this week. Dx and testing, as well as d/w Uro MD, d/w pt and family.  Questions answered.  Verb understanding, agreeable to d/c home with outpt f/u.      Final Clinical Impressions(s) / ED Diagnoses   Final diagnoses:  None    ED Discharge Orders    None       Samuel JesterMcManus, Eliyanah Elgersma, DO 11/08/17 1231

## 2017-11-06 NOTE — Discharge Instructions (Signed)
Take the prescriptions as directed.  Call the Urologist on Monday to schedule a follow up appointment this week.  Return to the Emergency Department immediately sooner if worsening.

## 2017-11-07 LAB — URINE CULTURE: Culture: <10000 — AB

## 2017-12-28 ENCOUNTER — Other Ambulatory Visit: Payer: Self-pay

## 2017-12-28 ENCOUNTER — Ambulatory Visit (HOSPITAL_COMMUNITY)
Admission: EM | Admit: 2017-12-28 | Discharge: 2017-12-28 | Disposition: A | Discharge status: Home / Self Care | Payer: Self-pay | Attending: Family Medicine | Admitting: Family Medicine

## 2017-12-28 ENCOUNTER — Encounter (HOSPITAL_COMMUNITY): Payer: Self-pay

## 2017-12-28 ENCOUNTER — Ambulatory Visit (INDEPENDENT_AMBULATORY_CARE_PROVIDER_SITE_OTHER): Payer: Self-pay

## 2017-12-28 DIAGNOSIS — M79671 Pain in right foot: Secondary | ICD-10-CM

## 2017-12-28 MED ORDER — MELOXICAM 7.5 MG PO TABS: 7.5000 mg | ORAL_TABLET | Freq: Every day | ORAL | 0 refills | Status: DC

## 2017-12-28 NOTE — ED Provider Notes (Signed)
[facility]    CSN: [id number] Arrival date & time: 12/28/17  0907     History   Chief Complaint Chief Complaint  Patient presents with  . Foot Pain    HPI Mitchell Martin is a 27 y.o. male.   27 year old male comes in for 2-day history of right foot pain after injury.  States last night, kicked the door frame, and impacted on the proximal MTPs.  Since then he has had swelling, pain, painful weightbearing.  He denies numbness, tingling.  Took some Tylenol with mild relief.     Past Medical History:  Diagnosis Date  . Back pain   . Kidney stone     There are no active problems to display for this patient.   History reviewed. No pertinent surgical history.     Home Medications    Prior to Admission medications   Medication Sig Start Date End Date Taking? Authorizing Provider  acetaminophen (TYLENOL) 500 MG tablet Take 1,000 mg by mouth every 6 (six) hours as needed for mild pain or headache.    [provider]  meloxicam (MOBIC) 7.5 MG tablet Take 1 tablet (7.5 mg total) by mouth daily. 12/28/17   Belinda Fisher, PA-C    Family History History reviewed. No pertinent family history.  Social History Social History   Tobacco Use  . Smoking status: Former Games developer  . Smokeless tobacco: Never Used  Substance Use Topics  . Alcohol use: No  . Drug use: No     Allergies   Patient has no known allergies.   Review of Systems Review of Systems  Reason unable to perform ROS: See HPI as above.     Physical Exam Triage Vital Signs ED Triage Vitals  Enc Vitals Group     BP 12/28/17 0933 120/72     Pulse Rate 12/28/17 0933 90     Resp 12/28/17 0933 18     Temp 12/28/17 0933 98 F (36.7 C)     Temp Source 12/28/17 0933 Oral     SpO2 12/28/17 0933 100 %     Weight 12/28/17 1002 180 lb (81.6 kg)     Height --      Head Circumference --      Peak Flow --      Pain Score --      Pain Loc --      Pain Edu? --      Excl. in GC? --    No  data found.  Updated Vital Signs BP 120/72 (BP Location: Right Arm)   Pulse 90   Temp 98 F (36.7 C) (Oral)   Resp 18   Wt 180 lb (81.6 kg)   SpO2 100%   BMI 25.10 kg/m   Physical Exam  Constitutional: He is oriented to person, place, and time. He appears well-developed and well-nourished. No distress.  HENT:  Head: Normocephalic and atraumatic.  Eyes: Pupils are equal, round, and reactive to light. Conjunctivae are normal.  Musculoskeletal:  Swelling to the 3rd-5th proximal MTP.  No erythema, warmth, contusion seen.  Tenderness to palpation of proximal MTPs.  Full range of motion of ankle.  Strength deferred.  Sensation intact ankle bilaterally.  Pedal pulse 2+ and equal bilaterally.  Neurological: He is alert and oriented to person, place, and time.  Skin: He is not diaphoretic.     UC Treatments / Results  Labs (all labs ordered are listed, but only abnormal results are displayed) Labs Reviewed -  No data to display  EKG None  Radiology Dg Foot Complete Right  Result Date: 12/28/2017 CLINICAL DATA:  The patient reports kicking a door with the right foot with onset of foot pain last evening press fell along the dorsum over the proximal metatarsals. EXAM: RIGHT FOOT COMPLETE - 3+ VIEW COMPARISON:  None. FINDINGS: The bones are subjectively adequately mineralized. The interphalangeal and MTP joint spaces are reasonably well-maintained. The tarsometatarsal and intertarsal joint spaces also appear normal. No acute phalangeal or metatarsal fracture is observed. The tarsal bones are intact. The soft tissues are unremarkable. IMPRESSION: There is no acute bony abnormality of the right foot. Electronically Signed   By: David  Swazil[place] M.D.   On: 12/28/2017 11:01    Procedures Procedures (including critical care time)  Medications Ordered in UC Medications - No data to display  Initial Impression / Assessment and Plan / UC Course  I have reviewed the triage vital signs and the  nursing notes.  Pertinent labs & imaging results that were available during my care of the patient were reviewed by me and considered in my medical decision making (see chart for details).    X-ray negative for fracture dislocation.  NSAIDs, ice compress, elevation, Ace wrap during activity.  Return precautions given.  Final Clinical Impressions(s) / UC Diagnoses   Final diagnoses:  Right foot pain    ED Prescriptions    Medication Sig Dispense Auth. Provider   meloxicam (MOBIC) 7.5 MG tablet Take 1 tablet (7.5 mg total) by mouth daily. 15 tablet Threasa AlphaYu, Classie Weng V, PA-C        Lyn Joens V, [place] JerseyPA-C 12/28/17 1117

## 2017-12-28 NOTE — ED Triage Notes (Signed)
Pt states he kicked a door frame by mistake.  Right foot

## 2017-12-28 NOTE — Discharge Instructions (Signed)
X-ray negative for fracture or dislocation.  Start Mobic as directed.  Ice compress, elevation, Ace wrap during activity.  This may take a few weeks to completely resolve, but should be feeling better each week.  Follow-up with PCP for further evaluation if symptoms not improving.

## 2018-01-10 ENCOUNTER — Emergency Department (HOSPITAL_COMMUNITY)
Admission: EM | Admit: 2018-01-10 | Discharge: 2018-01-10 | Disposition: A | Discharge status: Home / Self Care | Payer: Self-pay | Attending: Emergency Medicine | Admitting: Emergency Medicine

## 2018-01-10 ENCOUNTER — Ambulatory Visit (HOSPITAL_COMMUNITY)
Admission: EM | Admit: 2018-01-10 | Discharge: 2018-01-10 | Disposition: A | Discharge status: Home / Self Care | Payer: Self-pay

## 2018-01-10 ENCOUNTER — Encounter (HOSPITAL_COMMUNITY): Payer: Self-pay | Admitting: *Deleted

## 2018-01-10 DIAGNOSIS — M545 Low back pain: Secondary | ICD-10-CM | POA: Insufficient documentation

## 2018-01-10 DIAGNOSIS — Z5321 Procedure and treatment not carried out due to patient leaving prior to being seen by health care provider: Secondary | ICD-10-CM | POA: Insufficient documentation

## 2018-01-10 NOTE — ED Notes (Signed)
PT had to leave before being seen.

## 2018-01-10 NOTE — ED Triage Notes (Signed)
Pt with c/o lower back pain since last night.  Seen here for same per pt. Taking tylenol and muscle spasm medications at home.

## 2018-01-16 ENCOUNTER — Ambulatory Visit (HOSPITAL_COMMUNITY)
Admission: EM | Admit: 2018-01-16 | Discharge: 2018-01-16 | Disposition: A | Discharge status: Home / Self Care | Payer: Self-pay | Attending: Internal Medicine | Admitting: Internal Medicine

## 2018-01-16 ENCOUNTER — Encounter (HOSPITAL_COMMUNITY): Payer: Self-pay | Admitting: Emergency Medicine

## 2018-01-16 DIAGNOSIS — M545 Low back pain, unspecified: Secondary | ICD-10-CM

## 2018-01-16 MED ORDER — CYCLOBENZAPRINE HCL 5 MG PO TABS: 5.0000 mg | ORAL_TABLET | Freq: Every day | ORAL | 0 refills | Status: DC

## 2018-01-16 MED ORDER — MELOXICAM 15 MG PO TABS: 15.0000 mg | ORAL_TABLET | Freq: Every day | ORAL | 0 refills | Status: DC

## 2018-01-16 NOTE — ED Provider Notes (Signed)
[facility]    CSN: [id number] Arrival date & time: 01/16/18  1503     History   Chief Complaint Chief Complaint  Patient presents with  . Back Pain    HPI CLEBURNE SAVINI is a 27 y.o. male.   Ryot presents with complaints of right low back pain which started approximately 4-5 days ago. Was entire low back but has improved. States had worked longer hours and overtime then followed with work at home with bending. Triggered pain and spasms of low back. Worse with position changes. Pain 6/10. Taking tylenol which has not helped. No numbness or tingling to lower extremities. No weakness. Pain doesn't radiate. No loss of bladder or bowel function. Has had back injury in the past as well.    ROS per HPI.      Past Medical History:  Diagnosis Date  . Back pain   . Kidney stone     There are no active problems to display for this patient.   History reviewed. No pertinent surgical history.     Home Medications    Prior to Admission medications   Medication Sig Start Date End Date Taking? Authorizing Provider  acetaminophen (TYLENOL) 500 MG tablet Take 1,000 mg by mouth every 6 (six) hours as needed for mild pain or headache.    [provider]  cyclobenzaprine (FLEXERIL) 5 MG tablet Take 1 tablet (5 mg total) by mouth at bedtime. 01/16/18   Georgetta Haber, NP  meloxicam (MOBIC) 15 MG tablet Take 1 tablet (15 mg total) by mouth daily. 01/16/18   Georgetta Haber, NP    Family History History reviewed. No pertinent family history.  Social History Social History   Tobacco Use  . Smoking status: Former Games developer  . Smokeless tobacco: Never Used  Substance Use Topics  . Alcohol use: No  . Drug use: No     Allergies   Patient has no known allergies.   Review of Systems Review of Systems   Physical Exam Triage Vital Signs ED Triage Vitals [01/16/18 1519]  Enc Vitals Group     BP 124/61     Pulse Rate 80     Resp 18     Temp 98.5 F  (36.9 C)     Temp Source Oral     SpO2 99 %     Weight      Height      Head Circumference      Peak Flow      Pain Score 10     Pain Loc      Pain Edu?      Excl. in GC?    No data found.  Updated Vital Signs BP 124/61 (BP Location: Right Arm)   Pulse 80   Temp 98.5 F (36.9 C) (Oral)   Resp 18   SpO2 99%   Physical Exam  Constitutional: He is oriented to person, place, and time. He appears well-developed and well-nourished.  Cardiovascular: Normal rate and regular rhythm.  Pulmonary/Chest: Effort normal and breath sounds normal.  Abdominal: Soft. There is no CVA tenderness.  Musculoskeletal:       Right shoulder: He exhibits tenderness, bony tenderness, pain and spasm. He exhibits normal range of motion, no swelling, no effusion, no crepitus, no deformity, no laceration, normal pulse and normal strength.  Right low back pain as well as midline pain. No step off or deformity; strength equal bilaterally; gross sensation intact; ambulatory without difficulty; pain with bilateral  hip flexion and with straight leg raise to right low back; no radiation of pain; no hip pain  Neurological: He is alert and oriented to person, place, and time.  Skin: Skin is warm and dry.     UC Treatments / Results  Labs (all labs ordered are listed, but only abnormal results are displayed) Labs Reviewed - No data to display  EKG None  Radiology No results found.  Procedures Procedures (including critical care time)  Medications Ordered in UC Medications - No data to display  Initial Impression / Assessment and Plan / UC Course  I have reviewed the triage vital signs and the nursing notes.  Pertinent labs & imaging results that were available during my care of the patient were reviewed by me and considered in my medical decision making (see chart for details).     No red flag findings. meloxicam daily, flexeril at night. Activity as tolerated. Exercises provided. Encouraged follow  up with PCP as needed. Patient verbalized understanding and agreeable to plan.   Final Clinical Impressions(s) / UC Diagnoses   Final diagnoses:  Acute right-sided low back pain without sciatica     Discharge Instructions     Light and regular activity as tolerated.  Meloxicam daily, take with food.  Tylenol as needed as well.  Flexeril at night to help with spasms. May cause drowsiness. Please do not take if driving or drinking alcohol.   Sleep with pillows under knees.  Please follow up with primary care provider for recheck as may benefit from further evaluation and treatment if persistent.    ED Prescriptions    Medication Sig Dispense Auth. Provider   meloxicam (MOBIC) 15 MG tablet Take 1 tablet (15 mg total) by mouth daily. 20 tablet Linus Mako B, NP   cyclobenzaprine (FLEXERIL) 5 MG tablet Take 1 tablet (5 mg total) by mouth at bedtime. 15 tablet Georgetta Haber, NP     Controlled Substance Prescriptions [place] Controlled Substance Registry consulted? Not Applicable   Georgetta Haber, NP 01/16/18 [phone]

## 2018-01-16 NOTE — ED Triage Notes (Signed)
Pt here with lower back pain.

## 2018-01-16 NOTE — Discharge Instructions (Signed)
Light and regular activity as tolerated.  Meloxicam daily, take with food.  Tylenol as needed as well.  Flexeril at night to help with spasms. May cause drowsiness. Please do not take if driving or drinking alcohol.   Sleep with pillows under knees.  Please follow up with primary care provider for recheck as may benefit from further evaluation and treatment if persistent.

## 2018-09-28 ENCOUNTER — Encounter (HOSPITAL_COMMUNITY): Payer: Self-pay

## 2018-09-28 ENCOUNTER — Emergency Department (HOSPITAL_COMMUNITY)
Admission: EM | Admit: 2018-09-28 | Discharge: 2018-09-28 | Disposition: A | Discharge status: Home / Self Care | Payer: Self-pay | Attending: Emergency Medicine | Admitting: Emergency Medicine

## 2018-09-28 ENCOUNTER — Other Ambulatory Visit: Payer: Self-pay

## 2018-09-28 ENCOUNTER — Emergency Department (HOSPITAL_COMMUNITY): Payer: Self-pay

## 2018-09-28 DIAGNOSIS — N390 Urinary tract infection, site not specified: Secondary | ICD-10-CM | POA: Insufficient documentation

## 2018-09-28 DIAGNOSIS — N201 Calculus of ureter: Secondary | ICD-10-CM | POA: Insufficient documentation

## 2018-09-28 LAB — BASIC METABOLIC PANEL
Anion gap: 14 (ref 5–15)
BUN: 17 mg/dL (ref 6–20)
CO2: 24 mmol/L (ref 22–32)
Calcium: 9.8 mg/dL (ref 8.9–10.3)
Chloride: 103 mmol/L (ref 98–111)
Creatinine, Ser: 1.48 mg/dL — ABNORMAL HIGH (ref 0.61–1.24)
GFR calc Af Amer: >60 mL/min (ref >60)
GFR calc non Af Amer: >60 mL/min (ref >60)
Glucose, Bld: 131 mg/dL — ABNORMAL HIGH (ref 70–99)
Potassium: 3.8 mmol/L (ref 3.5–5.1)
Sodium: 141 mmol/L (ref 135–145)

## 2018-09-28 LAB — CBC WITH DIFFERENTIAL/PLATELET
Abs Immature Granulocytes: 0.02 10*3/uL (ref 0.00–0.07)
Basophils Absolute: 0 10*3/uL (ref 0.0–0.1)
Basophils Relative: 0 %
Eosinophils Absolute: 0 10*3/uL (ref 0.0–0.5)
Eosinophils Relative: 0 %
HCT: 39.9 % (ref 39.0–52.0)
Hemoglobin: 12.6 g/dL — ABNORMAL LOW (ref 13.0–17.0)
Immature Granulocytes: 0 %
Lymphocytes Relative: 13 %
Lymphs Abs: 1.4 10*3/uL (ref 0.7–4.0)
MCH: 23.5 pg — ABNORMAL LOW (ref 26.0–34.0)
MCHC: 31.6 g/dL (ref 30.0–36.0)
MCV: 74.3 fL — ABNORMAL LOW (ref 80.0–100.0)
Monocytes Absolute: 0.7 10*3/uL (ref 0.1–1.0)
Monocytes Relative: 7 %
Neutro Abs: 8.7 10*3/uL — ABNORMAL HIGH (ref 1.7–7.7)
Neutrophils Relative %: 80 %
Platelets: 269 10*3/uL (ref 150–400)
RBC: 5.37 MIL/uL (ref 4.22–5.81)
RDW: 15.1 % (ref 11.5–15.5)
WBC: 10.9 10*3/uL — ABNORMAL HIGH (ref 4.0–10.5)
nRBC: 0 % (ref 0.0–0.2)

## 2018-09-28 LAB — URINALYSIS, ROUTINE W REFLEX MICROSCOPIC
Bacteria, UA: NONE SEEN
Bilirubin Urine: NEGATIVE
Glucose, UA: NEGATIVE mg/dL
Ketones, ur: 20 mg/dL — AB
Leukocytes,Ua: NEGATIVE
Nitrite: NEGATIVE
Protein, ur: NEGATIVE mg/dL
RBC / HPF: >50 RBC/hpf — ABNORMAL HIGH (ref 0–5)
Specific Gravity, Urine: 1.026 (ref 1.005–1.030)
pH: 6 (ref 5.0–8.0)

## 2018-09-28 MED ORDER — DICLOFENAC SODIUM 75 MG PO TBEC: 75.0000 mg | DELAYED_RELEASE_TABLET | Freq: Two times a day (BID) | ORAL | 0 refills | Status: DC

## 2018-09-28 MED ORDER — CEPHALEXIN 500 MG PO CAPS: 500.0000 mg | ORAL_CAPSULE | Freq: Four times a day (QID) | ORAL | 0 refills | Status: DC

## 2018-09-28 MED ORDER — SODIUM CHLORIDE 0.9 % IV BOLUS (SEPSIS)
1000.0000 mL | Freq: Once | INTRAVENOUS | Status: AC
  Administered 2018-09-28: 1000 mL via INTRAVENOUS

## 2018-09-28 MED ORDER — SODIUM CHLORIDE 0.9 % IV SOLN
1.0000 g | Freq: Once | INTRAVENOUS | Status: AC
  Administered 2018-09-28: 1 g via INTRAVENOUS
  Filled 2018-09-28: qty 10

## 2018-09-28 MED ORDER — SODIUM CHLORIDE 0.9 % IV SOLN
1000.0000 mL | INTRAVENOUS | Status: DC
  Administered 2018-09-28: 1000 mL via INTRAVENOUS

## 2018-09-28 MED ORDER — HYDROMORPHONE HCL 1 MG/ML IJ SOLN
1.0000 mg | Freq: Once | INTRAMUSCULAR | Status: AC
  Administered 2018-09-28: 10:00:00 1 mg via INTRAVENOUS
  Filled 2018-09-28: qty 1

## 2018-09-28 MED ORDER — ONDANSETRON HCL 4 MG/2ML IJ SOLN
4.0000 mg | Freq: Once | INTRAMUSCULAR | Status: AC
  Administered 2018-09-28: 10:00:00 4 mg via INTRAVENOUS
  Filled 2018-09-28: qty 2

## 2018-09-28 MED ORDER — OXYCODONE-ACETAMINOPHEN 5-325 MG PO TABS: 1.0000 | ORAL_TABLET | Freq: Four times a day (QID) | ORAL | 0 refills | Status: DC | PRN

## 2018-09-28 MED ORDER — PROMETHAZINE HCL 12.5 MG PO TABS: 12.5000 mg | ORAL_TABLET | Freq: Four times a day (QID) | ORAL | 0 refills | Status: AC | PRN

## 2018-09-28 MED ORDER — KETOROLAC TROMETHAMINE 30 MG/ML IJ SOLN
30.0000 mg | Freq: Once | INTRAMUSCULAR | Status: AC
  Administered 2018-09-28: 30 mg via INTRAVENOUS
  Filled 2018-09-28: qty 1

## 2018-09-28 NOTE — Discharge Instructions (Signed)
You have a 6 mm kidney stone pads in the ureter.  Please strain all urine.  If you should catch the stone, please place it between 2 pieces of tape, and take it with you to the urology specialist.  It is important that you see the urology specialist concerning this stone.  Your urine analysis is abnormal, and may represent some early infection.  Please use Keflex with breakfast, lunch, dinner, and at bedtime.  Please use diclofenac 2 times daily.  Use promethazine every 6 hours if needed for nausea/vomiting.  May use Percocet for severe pain.  Percocet and promethazine may cause drowsiness, and or lightheadedness.  Please do not drive a vehicle, drink alcohol, operate machinery, or participate in activities requiring concentration when taking either these medications.  Please see the [facility] as soon as possible.

## 2018-09-28 NOTE — ED Notes (Signed)
Pt reports right sided flank pain

## 2018-09-28 NOTE — ED Triage Notes (Signed)
Pt reports r flank pain since last night.  Reports nauseated.  Pt says has history of kidney stones.

## 2018-09-28 NOTE — ED Provider Notes (Signed)
[facility] Provider Note   CSN: [id number] Arrival date & time: 09/28/18  0848     History   Chief Complaint No chief complaint on file.   HPI Mitchell Martin is a 28 y.o. male.     Patient is a 28 year old male who presents to the emergency department with complaint of right flank pain.  The patient states he started having some mild discomfort approximately a week or 1-1/2 weeks ago.  He says he has been having some off-and-on pain since that time.  On last night he had severe pain on the right flank moving toward the right groin area.  He says he has had similar symptoms with kidney stones.  He tried conservative measures but they could not take the pain any longer and came into the emergency department this morning.  The patient denies fever.  He states he has had some chills.  He has been nauseated with vomiting during the night.  No blood in the vomitus, no blood in the urine.  No changes in his stools recently.  No recent operations or procedures involving the abdomen or flank areas.  The history is provided by the patient.    Past Medical History:  Diagnosis Date  . Back pain   . Kidney stone     There are no active problems to display for this patient.   No past surgical history on file.      Home Medications    Prior to Admission medications   Medication Sig Start Date End Date Taking? Authorizing Provider  acetaminophen (TYLENOL) 500 MG tablet Take 1,000 mg by mouth every 6 (six) hours as needed for mild pain or headache.    [provider]  cyclobenzaprine (FLEXERIL) 5 MG tablet Take 1 tablet (5 mg total) by mouth at bedtime. 01/16/18   Zigmund Gottron, NP  meloxicam (MOBIC) 15 MG tablet Take 1 tablet (15 mg total) by mouth daily. 01/16/18   Zigmund Gottron, NP    Family History No family history on file.  Social History Social History   Tobacco Use  . Smoking status: Former Research scientist (life sciences)  . Smokeless tobacco: Never Used   Substance Use Topics  . Alcohol use: No  . Drug use: No     Allergies   Patient has no known allergies.   Review of Systems Review of Systems  Constitutional: Negative for activity change.       All ROS Neg except as noted in HPI  Eyes: Negative for photophobia and discharge.  Respiratory: Negative for cough, shortness of breath and wheezing.   Cardiovascular: Negative for chest pain and palpitations.  Gastrointestinal: Negative for abdominal pain and blood in stool.  Genitourinary: Positive for flank pain. Negative for dysuria, frequency and hematuria.  Musculoskeletal: Negative for arthralgias, back pain and neck pain.  Skin: Negative.   Neurological: Negative for dizziness, seizures and speech difficulty.  Psychiatric/Behavioral: Negative for confusion and hallucinations.     Physical Exam Updated Vital Signs There were no vitals taken for this visit.  Physical Exam Vitals signs and nursing note reviewed.  Constitutional:      Appearance: He is well-developed. He is not toxic-appearing.  HENT:     Head: Normocephalic.     Right Ear: Tympanic membrane and external ear normal.     Left Ear: Tympanic membrane and external ear normal.  Eyes:     General: Lids are normal.     Pupils: Pupils are equal, round, and  reactive to light.  Neck:     Musculoskeletal: Normal range of motion and neck supple.     Vascular: No carotid bruit.  Cardiovascular:     Rate and Rhythm: Normal rate and regular rhythm.     Pulses: Normal pulses.     Heart sounds: Normal heart sounds.  Pulmonary:     Effort: No respiratory distress.     Breath sounds: Normal breath sounds.  Abdominal:     General: Bowel sounds are normal.     Palpations: Abdomen is soft.     Tenderness: There is no abdominal tenderness. There is right CVA tenderness. There is no guarding.     Comments: Tenderness from the right flank to  Right lower abd.  Musculoskeletal: Normal range of motion.  Lymphadenopathy:      Head:     Right side of head: No submandibular adenopathy.     Left side of head: No submandibular adenopathy.     Cervical: No cervical adenopathy.  Skin:    General: Skin is warm and dry.  Neurological:     Mental Status: He is alert and oriented to person, place, and time.     Cranial Nerves: No cranial nerve deficit.     Sensory: No sensory deficit.  Psychiatric:        Speech: Speech normal.      ED Treatments / Results  Labs (all labs ordered are listed, but only abnormal results are displayed) Labs Reviewed - No data to display  EKG    Radiology No results found.  Procedures Procedures (including critical care time)  Medications Ordered in ED Medications - No data to display   Initial Impression / Assessment and Plan / ED Course  I have reviewed the triage vital signs and the nursing notes.  Pertinent labs & imaging results that were available during my care of the patient were reviewed by me and considered in my medical decision making (see chart for details).         Final Clinical Impressions(s) / ED Diagnoses MDM  Vital signs reviewed.  Patient reports some chills, nausea, vomiting, and flank pain.  Patient will be evaluated for infected stone, urinary tract infection, and ureteral lithiasis.  Patient treated with IV pain medication and antiemetics.  Complete blood count shows the white blood cells to be slightly elevated at 10,900.  There is no shift to the left.  The basic metabolic panel shows the creatinine to be elevated at 1.48.  The patient has had elevated creatinines in this range on previous evaluations at this facility.  The glomerular filtration rate is normal at greater than 60. Urine analysis shows a clear yellow specimen with a specific gravity of 1.026.  The patient has moderate hemoglobin present.  20 mg of ketone present.  Greater than 50 red blood cells, and 6-10 white blood cells are present.  Uric acid crystals are seen on the  specimen.  CT scanning shows an obstructing 6 mm right UPJ stone with moderate right hydronephrosis.  There are also nonobstructing stones in the lower kidney bilaterally. Patient is asked to strain all urine.  He is asked to follow-up with the urology specialist at [facility].  Prescription for Keflex 500 mg 4 times daily, diclofenac 2 times daily with food, promethazine every 6 hours for nausea, and Percocet every 6 hours for severe pain given to the patient.  Patient will return to the emergency department if any emergent changes in his condition, problems, or  concerns.   Final diagnoses:  Ureterolithiasis  Lower urinary tract infectious disease    ED Discharge Orders         Ordered    cephALEXin (KEFLEX) 500 MG capsule  4 times daily     09/28/18 1119    oxyCODONE-acetaminophen (PERCOCET/ROXICET) 5-325 MG tablet  Every 6 hours PRN     09/28/18 1119    diclofenac (VOLTAREN) 75 MG EC tablet  2 times daily     09/28/18 1119    promethazine (PHENERGAN) 12.5 MG tablet  Every 6 hours PRN     09/28/18 1119           Ivery QualeBryant, Lukasz Rogus, PA-C 09/28/18 1125    Vanetta MuldersZackowski, Scott, MD 09/28/18 1700

## 2018-09-29 LAB — URINE CULTURE: Culture: NO GROWTH

## 2018-09-30 ENCOUNTER — Emergency Department (HOSPITAL_COMMUNITY): Payer: Self-pay

## 2018-09-30 ENCOUNTER — Other Ambulatory Visit: Payer: Self-pay

## 2018-09-30 ENCOUNTER — Emergency Department (HOSPITAL_COMMUNITY)
Admission: EM | Admit: 2018-09-30 | Discharge: 2018-09-30 | Disposition: A | Discharge status: Home / Self Care | Payer: Self-pay | Attending: Emergency Medicine | Admitting: Emergency Medicine

## 2018-09-30 ENCOUNTER — Encounter (HOSPITAL_COMMUNITY): Payer: Self-pay

## 2018-09-30 DIAGNOSIS — Z87891 Personal history of nicotine dependence: Secondary | ICD-10-CM | POA: Insufficient documentation

## 2018-09-30 DIAGNOSIS — N23 Unspecified renal colic: Secondary | ICD-10-CM | POA: Insufficient documentation

## 2018-09-30 DIAGNOSIS — Z87442 Personal history of urinary calculi: Secondary | ICD-10-CM | POA: Insufficient documentation

## 2018-09-30 LAB — CBC WITH DIFFERENTIAL/PLATELET
Abs Immature Granulocytes: 0.02 10*3/uL (ref 0.00–0.07)
Basophils Absolute: 0 10*3/uL (ref 0.0–0.1)
Basophils Relative: 0 %
Eosinophils Absolute: 0.1 10*3/uL (ref 0.0–0.5)
Eosinophils Relative: 1 %
HCT: 36.4 % — ABNORMAL LOW (ref 39.0–52.0)
Hemoglobin: 11.8 g/dL — ABNORMAL LOW (ref 13.0–17.0)
Immature Granulocytes: 0 %
Lymphocytes Relative: 42 %
Lymphs Abs: 4 10*3/uL (ref 0.7–4.0)
MCH: 24.2 pg — ABNORMAL LOW (ref 26.0–34.0)
MCHC: 32.4 g/dL (ref 30.0–36.0)
MCV: 74.6 fL — ABNORMAL LOW (ref 80.0–100.0)
Monocytes Absolute: 0.8 10*3/uL (ref 0.1–1.0)
Monocytes Relative: 9 %
Neutro Abs: 4.5 10*3/uL (ref 1.7–7.7)
Neutrophils Relative %: 48 %
Platelets: 256 10*3/uL (ref 150–400)
RBC: 4.88 MIL/uL (ref 4.22–5.81)
RDW: 14.9 % (ref 11.5–15.5)
WBC: 9.4 10*3/uL (ref 4.0–10.5)
nRBC: 0 % (ref 0.0–0.2)

## 2018-09-30 LAB — URINALYSIS, ROUTINE W REFLEX MICROSCOPIC
Bacteria, UA: NONE SEEN
Bilirubin Urine: NEGATIVE
Glucose, UA: NEGATIVE mg/dL
Ketones, ur: 5 mg/dL — AB
Nitrite: NEGATIVE
Protein, ur: NEGATIVE mg/dL
RBC / HPF: >50 RBC/hpf — ABNORMAL HIGH (ref 0–5)
Specific Gravity, Urine: 1.013 (ref 1.005–1.030)
pH: 7 (ref 5.0–8.0)

## 2018-09-30 LAB — BASIC METABOLIC PANEL
Anion gap: 11 (ref 5–15)
BUN: 16 mg/dL (ref 6–20)
CO2: 26 mmol/L (ref 22–32)
Calcium: 9.3 mg/dL (ref 8.9–10.3)
Chloride: 104 mmol/L (ref 98–111)
Creatinine, Ser: 1.23 mg/dL (ref 0.61–1.24)
GFR calc Af Amer: >60 mL/min (ref >60)
GFR calc non Af Amer: >60 mL/min (ref >60)
Glucose, Bld: 110 mg/dL — ABNORMAL HIGH (ref 70–99)
Potassium: 3.6 mmol/L (ref 3.5–5.1)
Sodium: 141 mmol/L (ref 135–145)

## 2018-09-30 MED ORDER — KETOROLAC TROMETHAMINE 30 MG/ML IJ SOLN
30.0000 mg | Freq: Once | INTRAMUSCULAR | Status: AC
  Administered 2018-09-30: 30 mg via INTRAVENOUS
  Filled 2018-09-30: qty 1

## 2018-09-30 MED ORDER — ONDANSETRON 4 MG PO TBDP: 4.0000 mg | ORAL_TABLET | Freq: Three times a day (TID) | ORAL | 0 refills | Status: AC | PRN

## 2018-09-30 MED ORDER — HYDROMORPHONE HCL 1 MG/ML IJ SOLN
1.0000 mg | Freq: Once | INTRAMUSCULAR | Status: AC
  Administered 2018-09-30: 1 mg via INTRAVENOUS
  Filled 2018-09-30: qty 1

## 2018-09-30 MED ORDER — ONDANSETRON HCL 4 MG/2ML IJ SOLN
4.0000 mg | Freq: Once | INTRAMUSCULAR | Status: AC
  Administered 2018-09-30: 4 mg via INTRAVENOUS
  Filled 2018-09-30: qty 2

## 2018-09-30 MED ORDER — SODIUM CHLORIDE 0.9 % IV BOLUS
1000.0000 mL | Freq: Once | INTRAVENOUS | Status: AC
  Administered 2018-09-30: 1000 mL via INTRAVENOUS

## 2018-09-30 NOTE — ED Triage Notes (Signed)
PT returns for right side flank pain. Seen here recently.  Pain was tolerable until the evening. Said that he must have been too active and "jarred something loose"   Mitchell Martin that he took a pain pill at [place],

## 2018-09-30 NOTE — Discharge Instructions (Signed)
Take your pain and nausea medications as prescribed.  Follow-up with the urologist.  Return to the ED if you develop worsening pain, vomiting, inability urinate, fever or any other concerns.

## 2018-09-30 NOTE — ED Provider Notes (Signed)
[facility] EMERGENCY DEPARTMENT Provider Note   CSN: [id number] Arrival date & time: 09/30/18  0227     History   Chief Complaint Chief Complaint  Patient presents with   Flank Pain    Right    HPI Mitchell Martin is a 28 y.o. male.     Patient presents with right flank pain and nausea and vomiting.  States it became severe last night around 8 PM and again woke him from sleep around 2 AM.  He states he took 2 Percocets in the past day and has had several episodes of vomiting because the pain is been so bad.  He was diagnosed with a kidney stone of 6 mm on June 17.  He states this is his second kidney stone.  Has been taking Percocet, diclofenac and Phenergan at home.  He was also given Keflex for questionable infection.  He denies any fever.  He has pain with urination or blood in the urine.  The pain comes and goes and is worse on his right side.  He states he "overdid his activity" during the day yesterday which made the pain worse.  He denies any other medical problems.  The history is provided by the patient.  Flank Pain Associated symptoms include abdominal pain. Pertinent negatives include no headaches and no shortness of breath.    Past Medical History:  Diagnosis Date   Back pain    Kidney stone     There are no active problems to display for this patient.   History reviewed. No pertinent surgical history.      Home Medications    Prior to Admission medications   Medication Sig Start Date End Date Taking? Authorizing Provider  acetaminophen (TYLENOL) 500 MG tablet Take 1,000 mg by mouth every 6 (six) hours as needed for mild pain or headache.    [provider]  cephALEXin (KEFLEX) 500 MG capsule Take 1 capsule (500 mg total) by mouth 4 (four) times daily. 09/28/18   Ivery QualeBryant, Hobson, PA-C  cyclobenzaprine (FLEXERIL) 5 MG tablet Take 1 tablet (5 mg total) by mouth at bedtime. 01/16/18   Georgetta HaberBurky, Natalie B, NP  diclofenac (VOLTAREN) 75 MG EC tablet Take  1 tablet (75 mg total) by mouth 2 (two) times daily. 09/28/18   Ivery QualeBryant, Hobson, PA-C  meloxicam (MOBIC) 15 MG tablet Take 1 tablet (15 mg total) by mouth daily. 01/16/18   Georgetta HaberBurky, Natalie B, NP  oxyCODONE-acetaminophen (PERCOCET/ROXICET) 5-325 MG tablet Take 1 tablet by mouth every 6 (six) hours as needed. 09/28/18   Ivery QualeBryant, Hobson, PA-C  promethazine (PHENERGAN) 12.5 MG tablet Take 1 tablet (12.5 mg total) by mouth every 6 (six) hours as needed. 09/28/18   Ivery QualeBryant, Hobson, PA-C    Family History History reviewed. No pertinent family history.  Social History Social History   Tobacco Use   Smoking status: Former Smoker   Smokeless tobacco: Never Used  Substance Use Topics   Alcohol use: No   Drug use: No     Allergies   Patient has no known allergies.   Review of Systems Review of Systems  Constitutional: Positive for appetite change. Negative for activity change and fever.  HENT: Negative for congestion.   Respiratory: Negative for cough, chest tightness and shortness of breath.   Gastrointestinal: Positive for abdominal pain, nausea and vomiting.  Genitourinary: Positive for dysuria, flank pain and hematuria. Negative for testicular pain and urgency.  Musculoskeletal: Positive for back pain. Negative for arthralgias and myalgias.  Skin:  Negative for rash.  Neurological: Negative for dizziness, weakness and headaches.    all other systems are negative except as noted in the HPI and PMH.    Physical Exam Updated Vital Signs BP (!) 145/88    Pulse 87    Temp 98.4 F (36.9 C) (Oral)    Resp 18    Ht 5\' 11"  (1.803 m)    Wt 81.6 kg    SpO2 100%    BMI 25.09 kg/m   Physical Exam Vitals signs and nursing note reviewed.  Constitutional:      General: He is not in acute distress.    Appearance: He is well-developed.  HENT:     Head: Normocephalic and atraumatic.     Mouth/Throat:     Pharynx: No oropharyngeal exudate.  Eyes:     Conjunctiva/sclera: Conjunctivae normal.      Pupils: Pupils are equal, round, and reactive to light.  Neck:     Musculoskeletal: Normal range of motion and neck supple.     Comments: No meningismus. Cardiovascular:     Rate and Rhythm: Normal rate and regular rhythm.     Heart sounds: Normal heart sounds. No murmur.  Pulmonary:     Effort: Pulmonary effort is normal. No respiratory distress.     Breath sounds: Normal breath sounds.  Abdominal:     Palpations: Abdomen is soft.     Tenderness: There is abdominal tenderness. There is no guarding or rebound.  Musculoskeletal: Normal range of motion.        General: Tenderness present.     Comments: Right paraspinal CVA tenderness  Skin:    General: Skin is warm.     Capillary Refill: Capillary refill takes less than 2 seconds.  Neurological:     General: No focal deficit present.     Mental Status: He is alert and oriented to person, place, and time. Mental status is at baseline.     Cranial Nerves: No cranial nerve deficit.     Motor: No abnormal muscle tone.     Coordination: Coordination normal.     Comments: No ataxia on finger to nose bilaterally. No pronator drift. 5/5 strength throughout. CN 2-12 intact.Equal grip strength. Sensation intact.   Psychiatric:        Behavior: Behavior normal.      ED Treatments / Results  Labs (all labs ordered are listed, but only abnormal results are displayed) Labs Reviewed  CBC WITH DIFFERENTIAL/PLATELET - Abnormal; Notable for the following components:      Result Value   Hemoglobin 11.8 (*)    HCT 36.4 (*)    MCV 74.6 (*)    MCH 24.2 (*)    All other components within normal limits  BASIC METABOLIC PANEL - Abnormal; Notable for the following components:   Glucose, Bld 110 (*)    All other components within normal limits  URINALYSIS, ROUTINE W REFLEX MICROSCOPIC - Abnormal; Notable for the following components:   APPearance HAZY (*)    Hgb urine dipstick MODERATE (*)    Ketones, ur 5 (*)    Leukocytes,Ua TRACE (*)    RBC  / HPF >50 (*)    All other components within normal limits    EKG    Radiology Ct Renal Stone Study  Result Date: 09/28/2018 CLINICAL DATA:  Right flank pain and nausea since last night. History of nephrolithiasis. EXAM: CT ABDOMEN AND PELVIS WITHOUT CONTRAST TECHNIQUE: Multidetector CT imaging of the abdomen and pelvis was performed  following the standard protocol without IV contrast. COMPARISON:  11/06/2017 CT abdomen/pelvis. FINDINGS: Lower chest: No significant pulmonary nodules or acute consolidative airspace disease. Hepatobiliary: Normal liver size. No liver mass. Normal gallbladder with no radiopaque cholelithiasis. No biliary ductal dilatation. Pancreas: Normal, with no mass or duct dilation. Spleen: Normal size. No mass. Adrenals/Urinary Tract: Normal adrenals. Obstructing 6 mm right ureteropelvic junction stone with moderate right hydronephrosis and asymmetric right perinephric edema. Few additional nonobstructing lower right renal stones, largest 5 mm. Nonobstructing 3 mm lower left renal stone. No left hydronephrosis. No contour deforming renal masses. Otherwise normal caliber ureters, with no additional ureteral stones. Collapsed normal bladder. Stomach/Bowel: Normal non-distended stomach. Normal caliber small bowel with no small bowel wall thickening. Normal appendix. Normal large bowel with no diverticulosis, large bowel wall thickening or pericolonic fat stranding. Vascular/Lymphatic: Normal caliber abdominal aorta no pathologically enlarged lymph nodes in the abdomen or pelvis. Reproductive: Normal size prostate. Other: No pneumoperitoneum, ascites or focal fluid collection. Musculoskeletal: No aggressive appearing focal osseous lesions. IMPRESSION: 1. Obstructing 6 mm right UPJ stone with moderate right hydronephrosis. 2. Additional nonobstructing stones in the lower kidneys bilaterally. Electronically Signed   By: Delbert PhenixJason A Poff M.D.   On: 09/28/2018 10:09    Procedures Procedures  (including critical care time)  Medications Ordered in ED Medications  sodium chloride 0.9 % bolus 1,000 mL (1,000 mLs Intravenous New Bag/Given 09/30/18 [id number])  HYDROmorphone (DILAUDID) injection 1 mg (1 mg Intravenous Given 09/30/18 0312)  ketorolac (TORADOL) 30 MG/ML injection 30 mg (30 mg Intravenous Given 09/30/18 0312)  ondansetron (ZOFRAN) injection 4 mg (4 mg Intravenous Given 09/30/18 [id number])     Initial Impression / Assessment and Plan / ED Course  I have reviewed the triage vital signs and the nursing notes.  Pertinent labs & imaging results that were available during my care of the patient were reviewed by me and considered in my medical decision making (see chart for details).       Known kidney stone returns with worsening pain, nausea and vomiting.  No fever.  CT scan from June 17 showed 6 mm right UPJ stone.   urinalysis today with red blood cells and a few white cells.  Culture pending from the other day showing no growth.  No fever at home.  X-ray shows stable position of kidney stone.  Patient feels pain resolved after 1 dose of pain and nausea medications in the ED.  He is tolerating p.o. and requesting discharge.  He states he has adequate prescriptions at home and has urology follow-up later today.  Discussed return precautions including worsening pain, fever, inability to urinate or any other concerns.  Final Clinical Impressions(s) / ED Diagnoses   Final diagnoses:  Ureteral colic    ED Discharge Orders    None       Damoni Erker, Jeannett SeniorStephen, MD 09/30/18 [id number]

## 2018-09-30 NOTE — ED Triage Notes (Signed)
Pt vomiting during triage due to pain

## 2018-09-30 NOTE — ED Notes (Signed)
Pt tolerating po fluids

## 2018-09-30 NOTE — ED Notes (Signed)
Pt states that he has a 80mm stone

## 2018-10-03 ENCOUNTER — Encounter (HOSPITAL_COMMUNITY)
Admission: RE | Disposition: A | Discharge status: Home / Self Care | Payer: Self-pay | Source: Other Acute Inpatient Hospital | Attending: Urology

## 2018-10-03 ENCOUNTER — Encounter (HOSPITAL_COMMUNITY): Payer: Self-pay | Admitting: Certified Registered Nurse Anesthetist

## 2018-10-03 ENCOUNTER — Other Ambulatory Visit: Payer: Self-pay

## 2018-10-03 ENCOUNTER — Other Ambulatory Visit (HOSPITAL_COMMUNITY)
Admission: RE | Admit: 2018-10-03 | Discharge: 2018-10-03 | Disposition: A | Discharge status: Home / Self Care | Payer: Self-pay | Source: Ambulatory Visit | Attending: Urology | Admitting: Urology

## 2018-10-03 ENCOUNTER — Other Ambulatory Visit: Payer: Self-pay | Admitting: Urology

## 2018-10-03 ENCOUNTER — Ambulatory Visit (HOSPITAL_COMMUNITY): Payer: Self-pay | Admitting: Certified Registered Nurse Anesthetist

## 2018-10-03 ENCOUNTER — Ambulatory Visit (HOSPITAL_COMMUNITY)
Admission: RE | Admit: 2018-10-03 | Discharge: 2018-10-03 | Disposition: A | Discharge status: Home / Self Care | Payer: Self-pay | Source: Other Acute Inpatient Hospital | Attending: Urology | Admitting: Urology

## 2018-10-03 ENCOUNTER — Ambulatory Visit (HOSPITAL_COMMUNITY): Payer: Self-pay

## 2018-10-03 DIAGNOSIS — Z1159 Encounter for screening for other viral diseases: Secondary | ICD-10-CM | POA: Insufficient documentation

## 2018-10-03 DIAGNOSIS — Z87891 Personal history of nicotine dependence: Secondary | ICD-10-CM | POA: Insufficient documentation

## 2018-10-03 DIAGNOSIS — N202 Calculus of kidney with calculus of ureter: Secondary | ICD-10-CM | POA: Insufficient documentation

## 2018-10-03 DIAGNOSIS — D649 Anemia, unspecified: Secondary | ICD-10-CM | POA: Insufficient documentation

## 2018-10-03 DIAGNOSIS — N35911 Unspecified urethral stricture, male, meatal: Secondary | ICD-10-CM | POA: Insufficient documentation

## 2018-10-03 DIAGNOSIS — N201 Calculus of ureter: Secondary | ICD-10-CM

## 2018-10-03 HISTORY — PX: CYSTOSCOPY/URETEROSCOPY/HOLMIUM LASER/STENT PLACEMENT: SHX6546

## 2018-10-03 LAB — SARS CORONAVIRUS 2 BY RT PCR (HOSPITAL ORDER, PERFORMED IN ~~LOC~~ HOSPITAL LAB): SARS Coronavirus 2: NEGATIVE

## 2018-10-03 SURGERY — CYSTOSCOPY/URETEROSCOPY/HOLMIUM LASER/STENT PLACEMENT
Anesthesia: General | Laterality: Right

## 2018-10-03 MED ORDER — OXYCODONE HCL 5 MG PO TABS
5.0000 mg | ORAL_TABLET | Freq: Once | ORAL | Status: AC | PRN
  Administered 2018-10-03: 5 mg via ORAL

## 2018-10-03 MED ORDER — ONDANSETRON HCL 4 MG/2ML IJ SOLN
INTRAMUSCULAR | Status: AC
  Filled 2018-10-03: qty 2

## 2018-10-03 MED ORDER — PROPOFOL 10 MG/ML IV BOLUS
INTRAVENOUS | Status: DC | PRN
  Administered 2018-10-03: 100 mg via INTRAVENOUS
  Administered 2018-10-03: 250 mg via INTRAVENOUS

## 2018-10-03 MED ORDER — KETOROLAC TROMETHAMINE 30 MG/ML IJ SOLN
15.0000 mg | Freq: Once | INTRAMUSCULAR | Status: AC | PRN
  Administered 2018-10-03: 15 mg via INTRAVENOUS

## 2018-10-03 MED ORDER — IOHEXOL 300 MG/ML  SOLN
INTRAMUSCULAR | Status: DC | PRN
  Administered 2018-10-03: 10 mL via INTRAVENOUS

## 2018-10-03 MED ORDER — PHENAZOPYRIDINE HCL 200 MG PO TABS: 200.0000 mg | ORAL_TABLET | Freq: Three times a day (TID) | ORAL | 1 refills | Status: AC | PRN

## 2018-10-03 MED ORDER — OXYCODONE HCL 5 MG/5ML PO SOLN: 5.0000 mg | Freq: Once | ORAL | Status: AC | PRN

## 2018-10-03 MED ORDER — FENTANYL CITRATE (PF) 100 MCG/2ML IJ SOLN
25.0000 ug | INTRAMUSCULAR | Status: AC | PRN
  Administered 2018-10-03: 25 ug via INTRAVENOUS
  Administered 2018-10-03 (×2): 50 ug via INTRAVENOUS
  Filled 2018-10-03: qty 2

## 2018-10-03 MED ORDER — MIDAZOLAM HCL 2 MG/2ML IJ SOLN
INTRAMUSCULAR | Status: AC
  Filled 2018-10-03: qty 2

## 2018-10-03 MED ORDER — SODIUM CHLORIDE 0.9 % IR SOLN
Status: DC | PRN
  Administered 2018-10-03: 6000 mL

## 2018-10-03 MED ORDER — CEFAZOLIN SODIUM-DEXTROSE 2-4 GM/100ML-% IV SOLN
2.0000 g | INTRAVENOUS | Status: AC
  Administered 2018-10-03: 2 g via INTRAVENOUS
  Filled 2018-10-03: qty 100

## 2018-10-03 MED ORDER — LIDOCAINE 2% (20 MG/ML) 5 ML SYRINGE
INTRAMUSCULAR | Status: DC | PRN
  Administered 2018-10-03: 60 mg via INTRAVENOUS

## 2018-10-03 MED ORDER — HYDROMORPHONE HCL 1 MG/ML IJ SOLN: 0.2500 mg | INTRAMUSCULAR | Status: DC | PRN

## 2018-10-03 MED ORDER — BELLADONNA ALKALOIDS-OPIUM 16.2-30 MG RE SUPP
RECTAL | Status: AC
  Filled 2018-10-03: qty 1

## 2018-10-03 MED ORDER — FENTANYL CITRATE (PF) 100 MCG/2ML IJ SOLN
INTRAMUSCULAR | Status: AC
  Filled 2018-10-03: qty 2

## 2018-10-03 MED ORDER — LIDOCAINE 2% (20 MG/ML) 5 ML SYRINGE
INTRAMUSCULAR | Status: AC
  Filled 2018-10-03: qty 5

## 2018-10-03 MED ORDER — OXYCODONE-ACETAMINOPHEN 5-325 MG PO TABS: 1.0000 | ORAL_TABLET | Freq: Four times a day (QID) | ORAL | 0 refills | Status: AC | PRN

## 2018-10-03 MED ORDER — KETOROLAC TROMETHAMINE 30 MG/ML IJ SOLN: 30.0000 mg | Freq: Once | INTRAMUSCULAR | Status: DC | PRN

## 2018-10-03 MED ORDER — KETOROLAC TROMETHAMINE 15 MG/ML IJ SOLN
INTRAMUSCULAR | Status: AC
  Filled 2018-10-03: qty 1

## 2018-10-03 MED ORDER — MIDAZOLAM HCL 2 MG/2ML IJ SOLN
INTRAMUSCULAR | Status: DC | PRN
  Administered 2018-10-03: 2 mg via INTRAVENOUS

## 2018-10-03 MED ORDER — DEXAMETHASONE SODIUM PHOSPHATE 4 MG/ML IJ SOLN
INTRAMUSCULAR | Status: DC | PRN
  Administered 2018-10-03: 10 mg via INTRAVENOUS

## 2018-10-03 MED ORDER — PROPOFOL 10 MG/ML IV BOLUS
INTRAVENOUS | Status: AC
  Filled 2018-10-03: qty 40

## 2018-10-03 MED ORDER — LACTATED RINGERS IV SOLN
INTRAVENOUS | Status: DC
  Administered 2018-10-03: 17:00:00 via INTRAVENOUS

## 2018-10-03 MED ORDER — OXYCODONE HCL 5 MG PO TABS
ORAL_TABLET | ORAL | Status: AC
  Administered 2018-10-03: 5 mg via ORAL
  Filled 2018-10-03: qty 1

## 2018-10-03 MED ORDER — BELLADONNA ALKALOIDS-OPIUM 16.2-60 MG RE SUPP
RECTAL | Status: DC | PRN
  Administered 2018-10-03: 1 via RECTAL

## 2018-10-03 MED ORDER — PROMETHAZINE HCL 25 MG/ML IJ SOLN: 6.2500 mg | INTRAMUSCULAR | Status: DC | PRN

## 2018-10-03 MED ORDER — DEXAMETHASONE SODIUM PHOSPHATE 10 MG/ML IJ SOLN
INTRAMUSCULAR | Status: AC
  Filled 2018-10-03: qty 1

## 2018-10-03 MED ORDER — ACETAMINOPHEN 500 MG PO TABS
1000.0000 mg | ORAL_TABLET | Freq: Once | ORAL | Status: AC
  Administered 2018-10-03: 1000 mg via ORAL
  Filled 2018-10-03: qty 2

## 2018-10-03 MED ORDER — ONDANSETRON HCL 4 MG/2ML IJ SOLN
4.0000 mg | INTRAMUSCULAR | Status: DC | PRN
  Administered 2018-10-03: 4 mg via INTRAVENOUS
  Filled 2018-10-03: qty 2

## 2018-10-03 MED ORDER — SODIUM CHLORIDE 0.9% FLUSH: 3.0000 mL | Freq: Two times a day (BID) | INTRAVENOUS | Status: DC

## 2018-10-03 SURGICAL SUPPLY — 23 items
BAG URO CATCHER STRL LF (MISCELLANEOUS) ×3 IMPLANT
BASKET STONE NCOMPASS (UROLOGICAL SUPPLIES) IMPLANT
CATH URET 5FR 28IN OPEN ENDED (CATHETERS) ×2 IMPLANT
CATH URET DUAL LUMEN 6-10FR 50 (CATHETERS) ×1 IMPLANT
CLOTH BEACON ORANGE TIMEOUT ST (SAFETY) ×3 IMPLANT
EXTRACTOR STONE NITINOL NGAGE (UROLOGICAL SUPPLIES) ×3 IMPLANT
FIBER LASER FLEXIVA 1000 (UROLOGICAL SUPPLIES) IMPLANT
FIBER LASER FLEXIVA 365 (UROLOGICAL SUPPLIES) IMPLANT
FIBER LASER FLEXIVA 550 (UROLOGICAL SUPPLIES) IMPLANT
FIBER LASER TRAC TIP (UROLOGICAL SUPPLIES) ×2 IMPLANT
GLOVE SURG SS PI 8.0 STRL IVOR (GLOVE) ×2 IMPLANT
GOWN STRL REUS W/TWL XL LVL3 (GOWN DISPOSABLE) ×3 IMPLANT
GUIDEWIRE STR DUAL SENSOR (WIRE) ×5 IMPLANT
KIT BALLIN UROMAX 15FX10 (LABEL) IMPLANT
KIT TURNOVER KIT A (KITS) IMPLANT
MANIFOLD NEPTUNE II (INSTRUMENTS) ×3 IMPLANT
PACK CYSTO (CUSTOM PROCEDURE TRAY) ×3 IMPLANT
SET HIGH PRES BAL DIL (LABEL) ×2
SHEATH URETERAL 12FRX35CM (MISCELLANEOUS) ×3 IMPLANT
STENT CONTOUR 6FRX26X.038 (STENTS) ×2 IMPLANT
TUBING CONNECTING 10 (TUBING) ×2 IMPLANT
TUBING CONNECTING 10' (TUBING) ×1
TUBING UROLOGY SET (TUBING) ×3 IMPLANT

## 2018-10-03 NOTE — Discharge Instructions (Signed)
Ureteral Stent Implantation, Care After Refer to this sheet in the next few weeks. These instructions provide you with information about caring for yourself after your procedure. Your health care provider may also give you more specific instructions. Your treatment has been planned according to current medical practices, but problems sometimes occur. Call your health care provider if you have any problems or questions after your procedure. What can I expect after the procedure? After the procedure, it is common to have:  Nausea.  Mild pain when you urinate. You may feel this pain in your lower back or lower abdomen. Pain should stop within a few minutes after you urinate. This may last for up to 1 week.  A small amount of blood in your urine for several days. Follow these instructions at home:  Medicines  Take over-the-counter and prescription medicines only as told by your health care provider.  If you were prescribed an antibiotic medicine, take it as told by your health care provider. Do not stop taking the antibiotic even if you start to feel better.  Do not drive for 24 hours if you received a sedative.  Do not drive or operate heavy machinery while taking prescription pain medicines. Activity  Return to your normal activities as told by your health care provider. Ask your health care provider what activities are safe for you.  Do not lift anything that is heavier than 10 lb (4.5 kg). Follow this limit for 1 week after your procedure, or for as long as told by your health care provider. General instructions  Watch for any blood in your urine. Call your health care provider if the amount of blood in your urine increases.  If you have a catheter: ? Follow instructions from your health care provider about taking care of your catheter and collection bag. ? Do not take baths, swim, or use a hot tub until your health care provider approves.  Drink enough fluid to keep your urine  clear or pale yellow.  Keep all follow-up visits as told by your health care provider. This is important. Contact a health care provider if:  You have pain that gets worse or does not get better with medicine, especially pain when you urinate.  You have difficulty urinating.  You feel nauseous or you vomit repeatedly during a period of more than 2 days after the procedure. Get help right away if:  Your urine is dark red or has blood clots in it.  You are leaking urine (have incontinence).  The end of the stent comes out of your urethra.  You cannot urinate.  You have sudden, sharp, or severe pain in your abdomen or lower back.  You have a fever.  Please bring the stones to the office at follow up.  You may remove the stent by pulling the attached string on Friday morning.  If you don't feel comfortable doing that, please call the office to get set up to come in and have it removed.   This information is not intended to replace advice given to you by your health care provider. Make sure you discuss any questions you have with your health care provider. Document Released: 11/30/2012 Document Revised: 09/05/2015 Document Reviewed: 10/12/2014 Elsevier Interactive Patient Education  2019 [employer].

## 2018-10-03 NOTE — Anesthesia Procedure Notes (Signed)
Procedure Name: LMA Insertion Date/Time: 10/03/2018 5:24 PM Performed by: Claudia Desanctis, CRNA Pre-anesthesia Checklist: Emergency Drugs available, Patient identified, Suction available and Patient being monitored Patient Re-evaluated:Patient Re-evaluated prior to induction Oxygen Delivery Method: Circle system utilized Preoxygenation: Pre-oxygenation with 100% oxygen Induction Type: IV induction LMA: LMA inserted LMA Size: 5.0 Number of attempts: 1 Placement Confirmation: positive ETCO2 and breath sounds checked- equal and bilateral Tube secured with: Tape Dental Injury: Teeth and Oropharynx as per pre-operative assessment

## 2018-10-03 NOTE — H&P (Signed)
CC: I have ureteral stone.  HPI: Mitchell Martin is a 28 year-old male patient who is here for ureteral stone.    Mitchell Martin is a [age] yo AAM who had the onset a week ago of discomfort and constipation. He then had severe right flank pain and went to the ED on 6/17 and was found to have a 5.808mm right proximal stone with obstruction on CT. He was treated and released but returned on 6/19 and a KUB showed no progression in the stone. He returns today with Nausea and vomiting along with chills without fever today. He had a stone last year and is not sure whether it passed. He has no prior GU history. Urine culture was negative.      ALLERGIES: None   MEDICATIONS: Keflex  Oxycodone Hcl  Voltaren     GU PSH: None   NON-GU PSH: None   GU PMH: None   NON-GU PMH: None   FAMILY HISTORY: 2 daughters - Other 3 Son's - Other Kidney Failure - Grandmother   SOCIAL HISTORY: Marital Status: Married Preferred Language: English; Race: Black or African American Current Smoking Status: Patient has never smoked.   Tobacco Use Assessment Completed: Used Tobacco in last 30 days? Does not drink caffeine.    REVIEW OF SYSTEMS:    GU Review Male:   Patient denies frequent urination, hard to postpone urination, burning/ pain with urination, get up at night to urinate, leakage of urine, stream starts and stops, trouble starting your stream, have to strain to urinate , erection problems, and penile pain.  Gastrointestinal (Upper):   Patient reports nausea. Patient denies vomiting and indigestion/ heartburn.  Gastrointestinal (Lower):   Patient reports constipation. Patient denies diarrhea.  Constitutional:   Patient denies fever, night sweats, weight loss, and fatigue.  Skin:   Patient denies skin rash/ lesion and itching.  Eyes:   Patient denies double vision and blurred vision.  Ears/ Nose/ Throat:   Patient denies sore throat and sinus problems.  Hematologic/Lymphatic:   Patient denies swollen glands and  easy bruising.  Cardiovascular:   Patient denies leg swelling and chest pains.  Respiratory:   Patient denies cough and shortness of breath.  Endocrine:   Patient denies excessive thirst.  Musculoskeletal:   Patient denies back pain and joint pain.  Neurological:   Patient denies headaches and dizziness.  Psychologic:   Patient denies depression and anxiety.   VITAL SIGNS:      10/03/2018 12:37 PM  Weight 175 lb / 79.38 kg  Height 71 in / 180.34 cm  BP 145/86 mmHg  Heart Rate 61 /min  Temperature 97.7 F / 36.5 C  BMI 24.4 kg/m   MULTI-SYSTEM PHYSICAL EXAMINATION:    Constitutional: Well-nourished. No physical deformities. Normally developed. Good grooming. in pain.  Neck: Neck symmetrical, not swollen. Normal tracheal position.  Respiratory: Normal breath sounds. No labored breathing, no use of accessory muscles.   Cardiovascular: Regular rate and rhythm. No murmur, no gallop.   Lymphatic: No enlargement of neck, axillae, groin.  Skin: No paleness, no jaundice, no cyanosis. No lesion, no ulcer, no rash.  Neurologic / Psychiatric: Oriented to time, oriented to place, oriented to person. No depression, no anxiety, no agitation.  Gastrointestinal: Abdominal tenderness, diffuse but worse on right. No mass, no rigidity, non obese abdomen.   Musculoskeletal: Normal gait and station of head and neck.     PAST DATA REVIEWED:  Source Of History:  Patient  Urine Test Review:   Urinalysis  X-Ray Review: KUB: Reviewed Films. Reviewed Report. Discussed With Patient.  C.T. Stone Protocol: Reviewed Films. Reviewed Report. Discussed With Patient.     PROCEDURES:          Urinalysis w/Scope Dipstick Dipstick Cont'd Micro  Color: Yellow Bilirubin: Neg mg/dL WBC/hpf: 0 - 5/hpf  Appearance: Clear Ketones: Neg mg/dL RBC/hpf: 3 - 10/hpf  Specific Gravity: 1.015 Blood: 1+ ery/uL Bacteria: Rare (0-9/hpf)  pH: 8.0 Protein: Trace mg/dL Cystals: NS (Not Seen)  Glucose: Neg mg/dL Urobilinogen: 0.2  mg/dL Casts: NS (Not Seen)    Nitrites: Neg Trichomonas: Not Present    Leukocyte Esterase: Trace leu/uL Mucous: Not Present      Epithelial Cells: NS (Not Seen)      Yeast: NS (Not Seen)      Sperm: Not Present         Ketoralac 60mg  - N8676, 72094 Qty: 60 Adm. By: Loree Fee Early  Unit: mg Lot No [id number]  Route: IM Exp. Date 09/12/2019  Freq: None Mfgr.:   Site: Right Buttock   ASSESSMENT:      ICD-10 Details  1 GU:   Ureteral calculus - N20.1 5.5mm right proximal stone with intractable pain and nausea. He needs acute intervention with URS. I have reviewed the risks of ureteroscopy including bleeding, infection, ureteral injury, need for a stent or secondary procedures, thrombotic events and anesthetic complications. I mentioned ESWL but with the diclofenac and stone position we would not be able to do that today.    PLAN:           Schedule Return Visit/Planned Activity: ASAP - Schedule Surgery  Procedure: 10/03/2018 at [facility] - [phone] - Ketoralac 60mg  (Toradol Per 15 Mg) - [id number], [id number]          Document Letter(s):  Created for Patient: Clinical Summary         Next Appointment:      Next Appointment: 10/10/2018 01:45 PM    Appointment Type: Postoperative Appointment    Location: [facility] [phone]    Provider: Azucena Fallen    Reason for Visit: S/P URETEROSCOPY

## 2018-10-03 NOTE — Progress Notes (Signed)
Called Dr Roanna Banning about patient's pain. Dr Roanna Banning to enter orders for fentanyl and zofran.

## 2018-10-03 NOTE — Anesthesia Preprocedure Evaluation (Addendum)
Anesthesia Evaluation  Patient identified by MRN, date of birth, ID band Patient awake    Reviewed: Allergy & Precautions, NPO status , Patient's Chart, lab work & pertinent test results  Airway Mallampati: II  TM Distance: >3 FB Neck ROM: Full    Dental no notable dental hx.    Pulmonary former smoker,    Pulmonary exam normal breath sounds clear to auscultation       Cardiovascular negative cardio ROS Normal cardiovascular exam Rhythm:Regular Rate:Normal     Neuro/Psych negative neurological ROS  negative psych ROS   GI/Hepatic negative GI ROS, Neg liver ROS,   Endo/Other  negative endocrine ROS  Renal/GU Renal disease     Musculoskeletal Back pain   Abdominal   Peds  Hematology  (+) anemia ,   Anesthesia Other Findings right ureteral stone  Reproductive/Obstetrics                            Anesthesia Physical Anesthesia Plan  ASA: II  Anesthesia Plan: General   Post-op Pain Management:    Induction: Intravenous  PONV Risk Score and Plan: 3 and Midazolam, Dexamethasone and Treatment may vary due to age or medical condition  Airway Management Planned: LMA  Additional Equipment:   Intra-op Plan:   Post-operative Plan: Extubation in OR  Informed Consent: I have reviewed the patients History and Physical, chart, labs and discussed the procedure including the risks, benefits and alternatives for the proposed anesthesia with the patient or authorized representative who has indicated his/her understanding and acceptance.     Dental advisory given  Plan Discussed with: CRNA  Anesthesia Plan Comments:       Anesthesia Quick Evaluation

## 2018-10-03 NOTE — Op Note (Signed)
Procedure: 1.  Cystoscopy with right retrograde pyelogram and interpretation. 2.  Cystoscopy with dilation of right ureteral stricture. 3.  Right ureteroscopy with holmium laser application, stone extraction and insertion of double-J stent.  Preop diagnosis: Right proximal ureteral stone and renal stones.  Postop diagnosis: Same with right distal ureteral stricture.  Surgeon: Dr. Irine Seal.  Anesthesia: General.  Specimen: Stone fragments.  Drain: 6 [place] by 26 cm right contour double-J stent with tether.  EBL: Minimal.  Complications: None.  Indications: Mitchell Martin is a 28 year old male who presented to the office today with intractable pain following 2 visits to the emergency room and a 5.8 mm right proximal ureteral stone with additional right lower pole stones.  After reviewing the options he elected ureteroscopy.  Procedure: He was given 2 g of Ancef.  He was taken the operating room where general anesthetic was induced.  He was placed in the lithotomy position and fitted with PAS hose.  His perineum and genitalia were prepped with Betadine solution and he was draped in usual sterile fashion.  Cystoscopy was performed using a 23 [place] scope and 30 degree lens.  Examination revealed a normal urethra.  The external sphincter was intact.  The prostatic urethra was short with minimal hyperplasia.  Examination of bladder revealed mild trabeculation and normal bladder mucosa.  Ureteral orifices were unremarkable.  Right retrograde pyelogram was performed using a 5 [place] open-ended catheter and Omnipaque.  A sensor wire was required to get the open-ended catheter into the ureteral orifice.  The right retrograde pyelogram demonstrated some narrowing of the distal ureter but an otherwise normal caliber ureter to the stone adjacent L3.  There was hydronephrosis proximal to the filling defect that represented the stone.  A sensor guidewire was then advanced through the open-ended catheter to  the kidney and the open-ended catheter was removed.  The cystoscope was removed and the inner core of a 35 cm 12/14 digital access sheath was passed.  I was unable to get the dilator beyond the distal ureter.  An attempt to pass the assembled sheath was also unsuccessful.  I then passed a single lumen digital flexible ureteroscope alongside the wire but was only able to advance it about 3 cm beyond the meatus before encountering an area of stricture.  At this point a 10 cm x 15 French high-pressure balloon was placed over the wire across the stricture.  The balloon was inflated to 20 atm and the waist at the location of the stricture was disrupted.  After the balloon was deflated and removed the digital access sheath was then successfully passed into the lower proximal ureter.  The inner core and wire were removed and a digital flexible ureteroscope was inserted.  There was some kinking of the ureter just below the stone which was visualized but because of this kinking I felt that a safety wire was indicated to provide ureteral straightening and ensure continued access.  The access sheath was reinserted over the wire, the inner core was removed and a second wire was passed to the kidney.  The access sheath was removed and the assembled sheath was then inserted over the working wire leaving the safety wire adjacent to the sheath.  The working wire and inner core were then removed.  The digital ureteroscope was then advanced through the sheath to the level of the stone and the stone was then engaged with a 200 m tract tip holmium laser fiber with the power settings of 0.5 J and 10  Hz.  The stone was broken and the manageable fragments which were then removed with an engage basket.  Once the ureteral stones had been retrieved, the ureteroscope was advanced into the kidney where 3 additional stones were removed from the calyces in the lower pole.  The stones were attached to Randall's plaques but were removed  without difficulty.  Inspection of the remaining collecting system demonstrated a few Randall's plaques but no significant stones.  The ureteroscope and access sheath were removed under direct vision and minimal ureteral mucosal injury or irritation were identified.  The area of stricture in the distal ureter had only a very short mucosal tear but it was felt that stenting was indicated.  The cystoscope was then inserted back over the wire and a 6 [place] by 26 cm contour double-J stent was advanced over the wire to the kidney under fluoroscopic guidance.  The wire was removed, leaving a good coil in the kidney and a good coil in the bladder.  The bladder was then drained and the cystoscope was removed, leaving the stent string exiting the urethra.  The string was secured to the patient's penis with pink tape.  A B&O suppository was placed.  He was taken down from lithotomy position, his anesthetic was reversed and he was moved recovery in stable condition.  There were no complications.  He will be given a stone fragments to bring to the office at follow-up.

## 2018-10-03 NOTE — Transfer of Care (Signed)
Immediate Anesthesia Transfer of Care Note  Patient: Mitchell Martin  Procedure(s) Performed: CYSTOSCOPY/RIGHT URETEROSCOPY/HOLMIUM LASER/STENT PLACEMENT (Right )  Patient Location: PACU  Anesthesia Type:General  Level of Consciousness: awake and patient cooperative  Airway & Oxygen Therapy: Patient Spontanous Breathing and Patient connected to face mask  Post-op Assessment: Report given to RN and Post -op Vital signs reviewed and stable  Post vital signs: Reviewed and stable  Last Vitals:  Vitals Value Taken Time  BP    Temp    Pulse 60 10/03/18 1830  Resp 15 10/03/18 1830  SpO2 100 % 10/03/18 1830  Vitals shown include unvalidated device data.  Last Pain:  Vitals:   10/03/18 1645  TempSrc:   PainSc: 3       Patients Stated Pain Goal: 4 (95/28/41 3244)  Complications: No apparent anesthesia complications

## 2018-10-04 ENCOUNTER — Encounter (HOSPITAL_COMMUNITY): Payer: Self-pay | Admitting: Urology

## 2018-10-04 NOTE — Anesthesia Postprocedure Evaluation (Signed)
Anesthesia Post Note  Patient: Mitchell Martin  Procedure(s) Performed: CYSTOSCOPY/RIGHT URETEROSCOPY/HOLMIUM LASER/STENT PLACEMENT (Right )     Patient location during evaluation: PACU Anesthesia Type: General Level of consciousness: awake and alert Pain management: pain level controlled Vital Signs Assessment: post-procedure vital signs reviewed and stable Respiratory status: spontaneous breathing, nonlabored ventilation, respiratory function stable and patient connected to nasal cannula oxygen Cardiovascular status: blood pressure returned to baseline and stable Postop Assessment: no apparent nausea or vomiting Anesthetic complications: no    Last Vitals:  Vitals:   10/03/18 1930 10/03/18 2023  BP: 137/88 (!) 128/92  Pulse: (!) 55 71  Resp: 14 13  Temp: 37.1 C 36.9 C  SpO2: 97% 98%    Last Pain:  Vitals:   10/03/18 2023  TempSrc:   PainSc: 2                  Ryan P Ellender

## 2018-10-17 ENCOUNTER — Ambulatory Visit (INDEPENDENT_AMBULATORY_CARE_PROVIDER_SITE_OTHER): Payer: BC Managed Care – PPO

## 2018-10-17 ENCOUNTER — Ambulatory Visit (HOSPITAL_COMMUNITY)
Admission: EM | Admit: 2018-10-17 | Discharge: 2018-10-17 | Disposition: A | Discharge status: Home / Self Care | Payer: BC Managed Care – PPO | Attending: Family Medicine | Admitting: Family Medicine

## 2018-10-17 ENCOUNTER — Encounter (HOSPITAL_COMMUNITY): Payer: Self-pay

## 2018-10-17 DIAGNOSIS — R0789 Other chest pain: Secondary | ICD-10-CM

## 2018-10-17 MED ORDER — CYCLOBENZAPRINE HCL 5 MG PO TABS: 5.0000 mg | ORAL_TABLET | Freq: Every day | ORAL | 0 refills | Status: AC

## 2018-10-17 MED ORDER — MELOXICAM 15 MG PO TABS: 15.0000 mg | ORAL_TABLET | Freq: Every day | ORAL | 0 refills | Status: AC

## 2018-10-17 NOTE — ED Triage Notes (Addendum)
Pt C/O right side chest pain underneath his chest by his ribs. Pt states it hurts when he takes deep breaths, walking and all sudden movement.

## 2018-10-17 NOTE — Discharge Instructions (Addendum)
Chest x ray was normal I believe  this is muscular pain.  We will try some antiinflammatories and muscle relaxant to see if this helps.  If symptoms worsen go to the ER.

## 2018-10-18 NOTE — ED Provider Notes (Signed)
[facility]    CSN: [id number] Arrival date & time: 10/17/18  1246     History   Chief Complaint Chief Complaint  Patient presents with  . Chest Pain    HPI Mitchell Martin is a 28 y.o. male.   Patient is a 28 year old male with past medical history of kidney stone.  He presents today with right-sided chest discomfort near rib area.  Pain has been constant.  Pain is worse with certain movements and taking deep breaths.  Denies any falls or injuries. He has not taken anything for the pain.  Denies any cough, chest congestion.  Patient with recent lithotripsy surgery.  No fevers, chills.  No recent traveling.  No history of DVT or PE.  ROS per HPI      Past Medical History:  Diagnosis Date  . Back pain   . Kidney stone     There are no active problems to display for this patient.   Past Surgical History:  Procedure Laterality Date  . CYSTOSCOPY/URETEROSCOPY/HOLMIUM LASER/STENT PLACEMENT Right 10/03/2018   Procedure: CYSTOSCOPY/RIGHT URETEROSCOPY/HOLMIUM LASER/STENT PLACEMENT;  Surgeon: Irine Seal, MD;  Location: [facility];  Service: Urology;  Laterality: Right;       Home Medications    Prior to Admission medications   Medication Sig Start Date End Date Taking? Authorizing Provider  acetaminophen (TYLENOL) 500 MG tablet Take 1,000 mg by mouth every 6 (six) hours as needed for mild pain or headache.    [provider]  cephALEXin (KEFLEX) 500 MG capsule Take 1 capsule (500 mg total) by mouth 4 (four) times daily. 09/28/18   Lily Kocher, PA-C  cyclobenzaprine (FLEXERIL) 5 MG tablet Take 1 tablet (5 mg total) by mouth at bedtime. 10/17/18   Loura Halt A, NP  meloxicam (MOBIC) 15 MG tablet Take 1 tablet (15 mg total) by mouth daily. 10/17/18   Loura Halt A, NP  ondansetron (ZOFRAN ODT) 4 MG disintegrating tablet Take 1 tablet (4 mg total) by mouth every 8 (eight) hours as needed for nausea or vomiting. 09/30/18   Rancour, Annie Main, MD   oxyCODONE-acetaminophen (PERCOCET/ROXICET) 5-325 MG tablet Take 1 tablet by mouth every 6 (six) hours as needed for moderate pain. 10/03/18   Irine Seal, MD  phenazopyridine (PYRIDIUM) 200 MG tablet Take 1 tablet (200 mg total) by mouth 3 (three) times daily as needed for pain. 10/03/18 10/03/19  Irine Seal, MD  promethazine (PHENERGAN) 12.5 MG tablet Take 1 tablet (12.5 mg total) by mouth every 6 (six) hours as needed. 09/28/18   Lily Kocher, PA-C    Family History History reviewed. No pertinent family history.  Social History Social History   Tobacco Use  . Smoking status: Former Research scientist (life sciences)  . Smokeless tobacco: Never Used  Substance Use Topics  . Alcohol use: No  . Drug use: No     Allergies   Patient has no known allergies.   Review of Systems Review of Systems   Physical Exam Triage Vital Signs ED Triage Vitals [10/17/18 1321]  Enc Vitals Group     BP 108/61     Pulse Rate 79     Resp 18     Temp 98.4 F (36.9 C)     Temp Source Oral     SpO2 100 %     Weight      Height      Head Circumference      Peak Flow      Pain Score  Pain Loc      Pain Edu?      Excl. in GC?    No data found.  Updated Vital Signs BP 108/61 (BP Location: Left Arm)   Pulse 79   Temp 98.4 F (36.9 C) (Oral)   Resp 18   SpO2 100%   Visual Acuity Right Eye Distance:   Left Eye Distance:   Bilateral Distance:    Right Eye Near:   Left Eye Near:    Bilateral Near:     Physical Exam Vitals signs and nursing note reviewed.  Constitutional:      General: He is not in acute distress.    Appearance: He is well-developed. He is not ill-appearing, toxic-appearing or diaphoretic.  HENT:     Head: Normocephalic and atraumatic.  Neck:     Musculoskeletal: Normal range of motion.  Cardiovascular:     Rate and Rhythm: Normal rate and regular rhythm.     Heart sounds: Normal heart sounds.  Pulmonary:     Effort: Pulmonary effort is normal.     Breath sounds: Normal breath  sounds.  Chest:     Chest wall: Tenderness present. No mass, lacerations, deformity, swelling, crepitus or edema.    Abdominal:     Palpations: Abdomen is soft.  Musculoskeletal: Normal range of motion.     Right lower leg: He exhibits no tenderness. No edema.     Left lower leg: He exhibits no tenderness. No edema.  Skin:    General: Skin is warm and dry.  Neurological:     General: No focal deficit present.     Mental Status: He is alert.  Psychiatric:        Mood and Affect: Mood normal.      UC Treatments / Results  Labs (all labs ordered are listed, but only abnormal results are displayed) Labs Reviewed - No data to display  EKG   Radiology Dg Ribs Unilateral W/chest Right  Result Date: 10/17/2018 CLINICAL DATA:  Anterior rib pain. EXAM: RIGHT RIBS AND CHEST - 3+ VIEW COMPARISON:  Chest x-ray 03/14/2013. FINDINGS: Mediastinum hilar structures normal. Lungs are clear. No pleural effusion or pneumothorax. Cardiomegaly with normal pulmonary vascularity. Mild thoracic spine scoliosis no acute or focal bony abnormality. IMPRESSION: No acute abnormality Electronically Signed   By: Maisie Fushomas  Register   On: 10/17/2018 14:30    Procedures Procedures (including critical care time)  Medications Ordered in UC Medications - No data to display  Initial Impression / Assessment and Plan / UC Course  I have reviewed the triage vital signs and the nursing notes.  Pertinent labs & imaging results that were available during my care of the patient were reviewed by me and considered in my medical decision making (see chart for details).    X ray normal No other concerning signs or symptoms and VSS No concern for ACS or PE Most likely chest wall pain or muscle spasm.  Will treat with muscle relaxant and NSAIDs.  Recommended heat to the area.  If symptoms worsen he will need to go to the ER.  Final Clinical Impressions(s) / UC Diagnoses   Final diagnoses:  Chest wall pain      Discharge Instructions     Chest x ray was normal I believe  this is muscular pain.  We will try some antiinflammatories and muscle relaxant to see if this helps.  If symptoms worsen go to the ER.       ED Prescriptions  Medication Sig Dispense Auth. Provider   meloxicam (MOBIC) 15 MG tablet Take 1 tablet (15 mg total) by mouth daily. 20 tablet Diavian Furgason A, NP   cyclobenzaprine (FLEXERIL) 5 MG tablet Take 1 tablet (5 mg total) by mouth at bedtime. 15 tablet Dahlia ByesBast, Jameek Bruntz A, NP     Controlled Substance Prescriptions [place] Controlled Substance Registry consulted? Not Applicable   Janace ArisBast, Carrol Bondar A, NP 10/18/18 [phone]

## 2019-10-31 IMAGING — DX ABDOMEN - 1 VIEW
2 series · 2 of 2 positions shown · non-contrast
Comparison: CT abdomen and pelvis 09/28/2018.  KUB 09/30/2018.

CLINICAL DATA: Known right ureteral stone. Pretreatment planning
study.

EXAM:
ABDOMEN - 1 VIEW

[t abdomen supine]
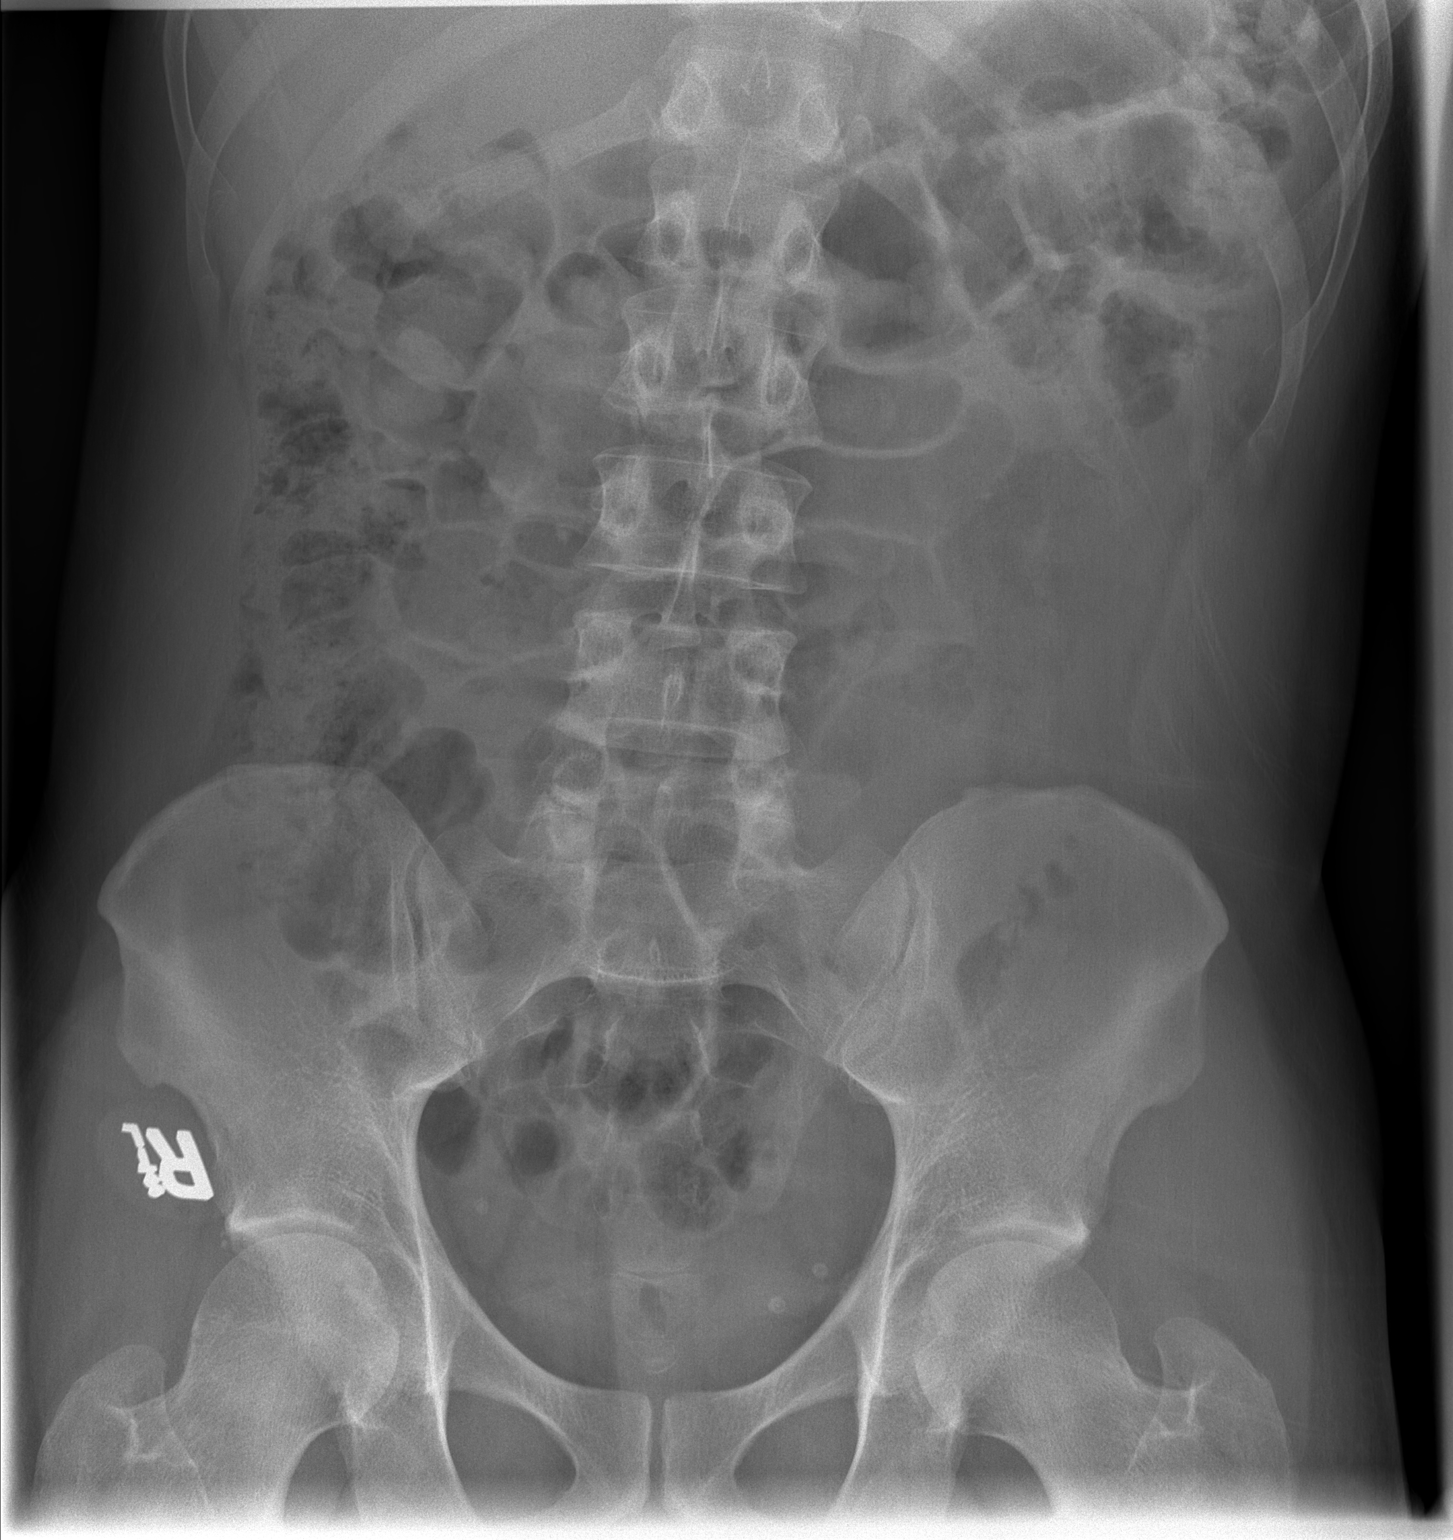

[abdomen kub]
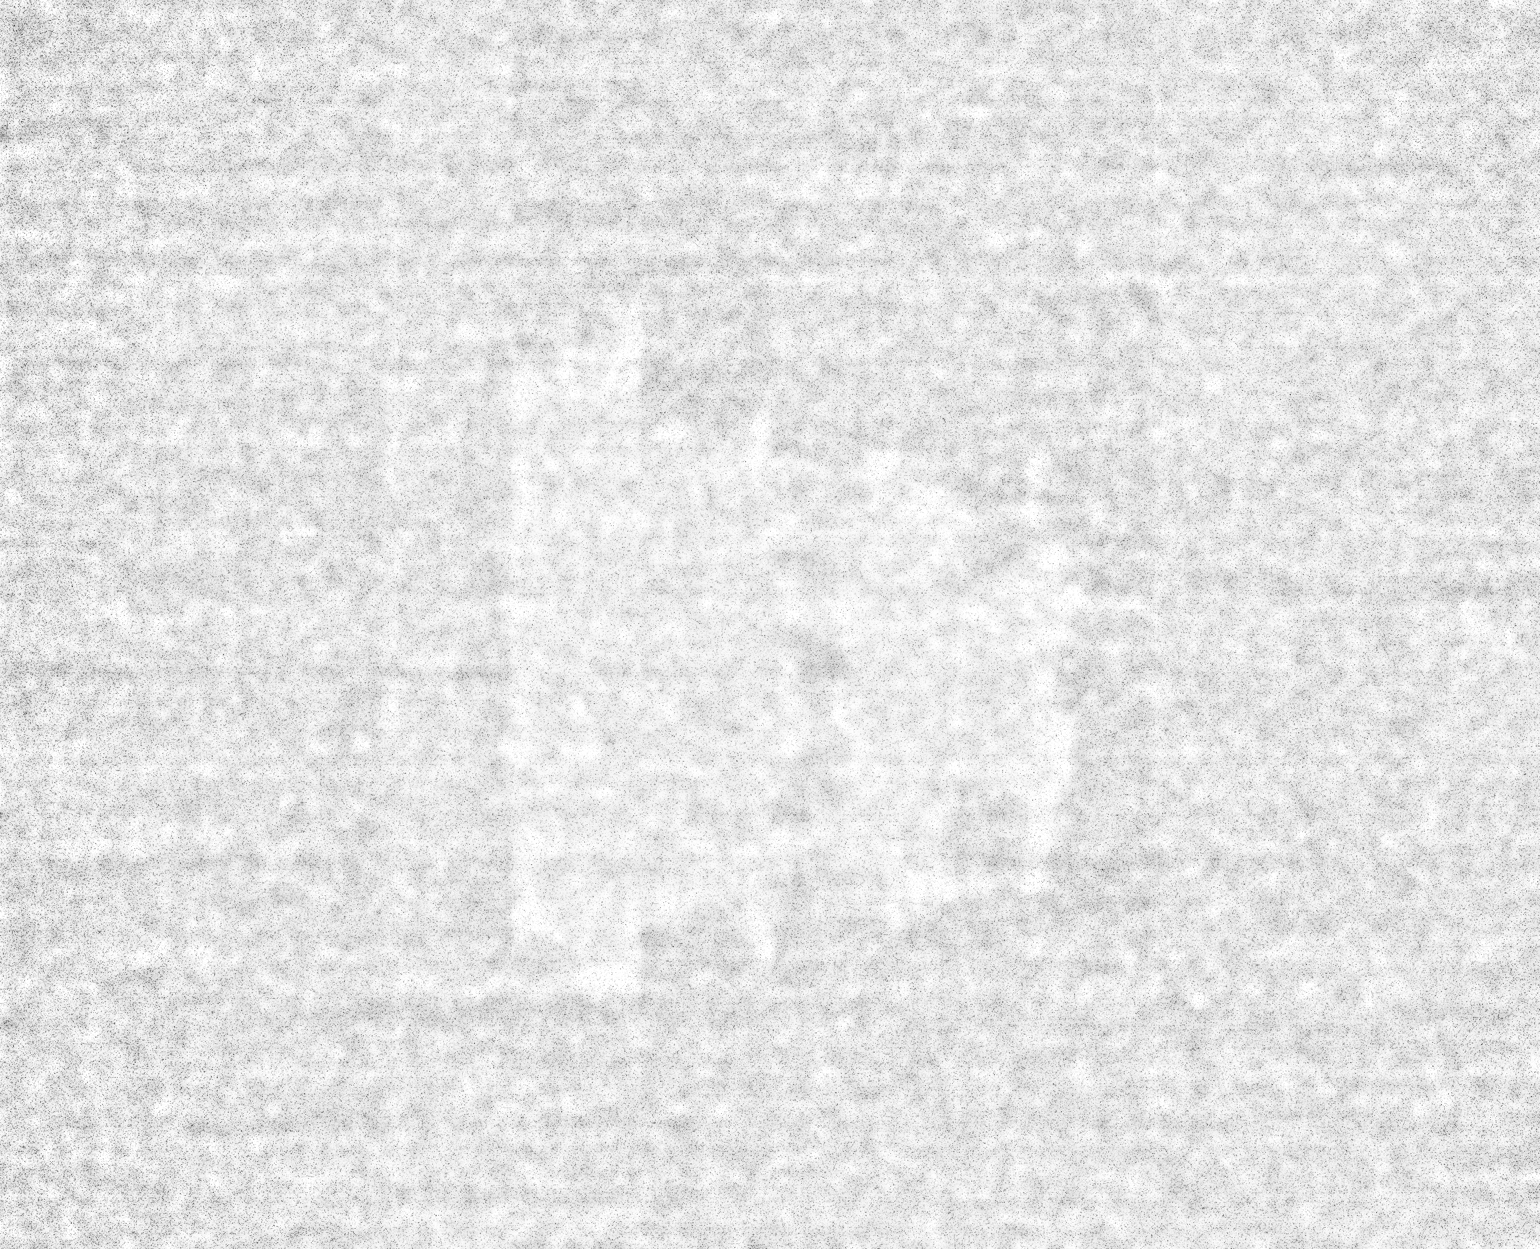

[2 of 2 positions shown; findings below may reference images not displayed]

FINDINGS: Calcification is again seen just inferior to the right L3 transverse
process. Small nonobstructing renal stones are present bilaterally
but somewhat difficult to visualize due to overlying gas and stool.
Bowel gas pattern is nonobstructive. No bony abnormality.
IMPRESSION: Right ureteral stone remains just inferior to the right L3
transverse process.

Nonobstructing bilateral renal stones.

## 2019-12-08 ENCOUNTER — Other Ambulatory Visit: Payer: Self-pay | Admitting: Sleep Medicine

## 2019-12-08 ENCOUNTER — Other Ambulatory Visit: Payer: Self-pay

## 2019-12-08 DIAGNOSIS — Z20822 Contact with and (suspected) exposure to covid-19: Secondary | ICD-10-CM

## 2019-12-09 LAB — SARS-COV-2, NAA 2 DAY TAT

## 2019-12-09 LAB — NOVEL CORONAVIRUS, NAA: SARS-CoV-2, NAA: NOT DETECTED

## 2020-03-23 ENCOUNTER — Emergency Department (HOSPITAL_COMMUNITY)
Admission: EM | Admit: 2020-03-23 | Discharge: 2020-03-23 | Disposition: A | Discharge status: Home / Self Care | Payer: BC Managed Care – PPO | Attending: Emergency Medicine | Admitting: Emergency Medicine

## 2020-03-23 ENCOUNTER — Other Ambulatory Visit: Payer: Self-pay

## 2020-03-23 DIAGNOSIS — Z87891 Personal history of nicotine dependence: Secondary | ICD-10-CM | POA: Insufficient documentation

## 2020-03-23 DIAGNOSIS — S51812A Laceration without foreign body of left forearm, initial encounter: Secondary | ICD-10-CM | POA: Insufficient documentation

## 2020-03-23 DIAGNOSIS — W311XXA Contact with metalworking machines, initial encounter: Secondary | ICD-10-CM | POA: Insufficient documentation

## 2020-03-23 DIAGNOSIS — Y990 Civilian activity done for income or pay: Secondary | ICD-10-CM | POA: Insufficient documentation

## 2020-03-23 MED ORDER — LIDOCAINE-EPINEPHRINE 1 %-1:100000 IJ SOLN
20.0000 mL | Freq: Once | INTRAMUSCULAR | Status: AC
  Administered 2020-03-23: 20 mL via INTRADERMAL
  Filled 2020-03-23: qty 1

## 2020-03-23 NOTE — ED Notes (Signed)
EDP at bedside for sutures 

## 2020-03-23 NOTE — ED Triage Notes (Signed)
Pt arrives with bandage to L forearm reporting lac from a piece of metal while putting a pump together at work this morning at approx 1100. Last tetanus last year.

## 2020-03-23 NOTE — Discharge Instructions (Addendum)
Have a healthcare provider remove your stitches in about 10 days. Watch out for signs of infection like worsening pain, redness, swelling, drainage, and/or fever; if you see any of these, follow-up with your PCP or come back to the ER.

## 2020-03-23 NOTE — ED Provider Notes (Signed)
[facility] EMERGENCY DEPARTMENT Provider Note   CSN: [id number] Arrival date & time: 03/23/20  1251     History Chief Complaint  Patient presents with  . Laceration    Mitchell Martin is a 29 y.o. male presents after he sustained a laceration to his left forearm from sheet metal at work.  No other injuries, no loss of consciousness, no head trauma.  Denies any numbness, tingling, weakness at this time.  Bleeding controlled.  The history is provided by the patient.  Laceration Location:  Shoulder/arm Shoulder/arm laceration location:  L forearm Depth:  Through dermis Quality: straight   Bleeding: controlled   Time since incident:  4 hours Laceration mechanism:  Metal edge Pain details:    Severity:  Moderate   Timing:  Constant   Progression:  Unchanged Foreign body present:  No foreign bodies Relieved by:  Nothing Worsened by:  Nothing Tetanus status:  Up to date Associated symptoms: no fever, no focal weakness, no numbness, no rash, no redness, no swelling and no streaking        Past Medical History:  Diagnosis Date  . Back pain   . Kidney stone     There are no problems to display for this patient.   Past Surgical History:  Procedure Laterality Date  . CYSTOSCOPY/URETEROSCOPY/HOLMIUM LASER/STENT PLACEMENT Right 10/03/2018   Procedure: CYSTOSCOPY/RIGHT URETEROSCOPY/HOLMIUM LASER/STENT PLACEMENT;  Surgeon: Bjorn Pippin, MD;  Location: [facility];  Service: Urology;  Laterality: Right;       No family history on file.  Social History   Tobacco Use  . Smoking status: Former Games developer  . Smokeless tobacco: Never Used  Vaping Use  . Vaping Use: Never used  Substance Use Topics  . Alcohol use: No  . Drug use: No    Home Medications Prior to Admission medications   Medication Sig Start Date End Date Taking? Authorizing Provider  acetaminophen (TYLENOL) 500 MG tablet Take 1,000 mg by mouth every 6 (six) hours as needed for mild pain or  headache.    [provider]  cephALEXin (KEFLEX) 500 MG capsule Take 1 capsule (500 mg total) by mouth 4 (four) times daily. 09/28/18   Ivery Quale, PA-C  cyclobenzaprine (FLEXERIL) 5 MG tablet Take 1 tablet (5 mg total) by mouth at bedtime. 10/17/18   Dahlia Byes A, NP  meloxicam (MOBIC) 15 MG tablet Take 1 tablet (15 mg total) by mouth daily. 10/17/18   Dahlia Byes A, NP  ondansetron (ZOFRAN ODT) 4 MG disintegrating tablet Take 1 tablet (4 mg total) by mouth every 8 (eight) hours as needed for nausea or vomiting. 09/30/18   Rancour, Jeannett Senior, MD  oxyCODONE-acetaminophen (PERCOCET/ROXICET) 5-325 MG tablet Take 1 tablet by mouth every 6 (six) hours as needed for moderate pain. 10/03/18   Bjorn Pippin, MD  promethazine (PHENERGAN) 12.5 MG tablet Take 1 tablet (12.5 mg total) by mouth every 6 (six) hours as needed. 09/28/18   Ivery Quale, PA-C    Allergies    Patient has no known allergies.  Review of Systems   Review of Systems  Constitutional: Negative for chills and fever.  HENT: Negative for ear pain and sore throat.   Eyes: Negative for pain and visual disturbance.  Respiratory: Negative for cough and shortness of breath.   Cardiovascular: Negative for chest pain and palpitations.  Gastrointestinal: Negative for abdominal pain and vomiting.  Genitourinary: Negative for dysuria and hematuria.  Musculoskeletal: Negative for arthralgias and back pain.  Skin: Positive for  wound. Negative for color change and rash.  Neurological: Negative for focal weakness, seizures and syncope.  All other systems reviewed and are negative.   Physical Exam Updated Vital Signs BP 130/90 (BP Location: Right Arm)   Pulse 72   Temp 99.2 F (37.3 C) (Oral)   Resp 17   SpO2 100%   Physical Exam Vitals and nursing note reviewed.  Constitutional:      Appearance: He is well-developed and well-nourished.  HENT:     Head: Normocephalic and atraumatic.     Right Ear: External ear normal.     Left  Ear: External ear normal.  Eyes:     Conjunctiva/sclera: Conjunctivae normal.  Cardiovascular:     Rate and Rhythm: Normal rate and regular rhythm.     Heart sounds: No murmur heard.   Pulmonary:     Effort: Pulmonary effort is normal. No respiratory distress.     Breath sounds: Normal breath sounds.  Abdominal:     General: There is no distension.     Palpations: Abdomen is soft.     Tenderness: There is no abdominal tenderness.  Musculoskeletal:        General: No edema.     Left wrist: Normal.     Left hand: Normal. No swelling, deformity or tenderness. Normal strength. Normal sensation. Normal capillary refill. Normal pulse.     Cervical back: Neck supple.     Comments: Radial, median, and ulnar nerve motor and sensory function intact on left hand.  Left 2+ ulnar and radial pulses.  5/5 left wrist flexion and extension.  All tendon functions intact.  Skin:    General: Skin is warm and dry.     Findings: Laceration (5cm hemostatic linear laceration to left forearm, no obvious tendon injury) present.  Neurological:     Mental Status: He is alert.  Psychiatric:        Mood and Affect: Mood and affect normal.     ED Results / Procedures / Treatments   Labs (all labs ordered are listed, but only abnormal results are displayed) Labs Reviewed - No data to display  EKG None  Radiology No results found.  Procedures .Marland KitchenLaceration Repair  Date/Time: 03/24/2020 1:42 AM Performed by: Gershon Mussel, MD Authorized by: Pricilla Loveless, MD   Consent:    Consent obtained:  Verbal   Consent given by:  Patient   Risks, benefits, and alternatives were discussed: yes     Risks discussed:  Infection, pain, poor cosmetic result, poor wound healing, tendon damage and nerve damage   Alternatives discussed:  Observation and no treatment Universal protocol:    Patient identity confirmed:  Verbally with patient Anesthesia:    Anesthesia method:  Local infiltration   Local  anesthetic:  Lidocaine 2% WITH epi Laceration details:    Location:  Shoulder/arm   Shoulder/arm location:  L lower arm Exploration:    Wound exploration: wound explored through full range of motion and entire depth of wound visualized     Wound extent: no foreign bodies/material noted, no muscle damage noted, no nerve damage noted, no tendon damage noted, no underlying fracture noted and no vascular damage noted     Contaminated: no   Treatment:    Area cleansed with:  Saline   Amount of cleaning:  Standard   Irrigation solution:  Sterile saline   Irrigation volume:  1000cc   Irrigation method:  Pressure wash   Visualized foreign bodies/material removed: no   Skin repair:  Repair method:  Sutures   Suture size:  4-0   Suture material:  Prolene   Suture technique:  Horizontal mattress and simple interrupted   Number of sutures: Two horizontal mattress, four simple unerupted. Approximation:    Approximation:  Close Repair type:    Repair type:  Simple Post-procedure details:    Dressing:  Antibiotic ointment, non-adherent dressing and tube gauze   Procedure completion:  Tolerated well, no immediate complications    Medications Ordered in ED Medications  lidocaine-EPINEPHrine (XYLOCAINE W/EPI) 1 %-1:100000 (with pres) injection 20 mL (20 mLs Intradermal Given by Other 03/23/20 1653)    ED Course  I have reviewed the triage vital signs and the nursing notes.  Pertinent labs & imaging results that were available during my care of the patient were reviewed by me and considered in my medical decision making (see chart for details).    MDM Rules/Calculators/A&P                          MDM: Mitchell Martin is a 29 y.o. male who presents with laceration as per above. I have reviewed the nursing documentation for past medical history, family history, and social history. Pertinent previous records reviewed. He is awake, alert. HDS. Afebrile. Physical exam is most notable for 5 cm  laceration to left anterior forearm without any other obvious foreign body or tendon laceration.  Wound repaired as above.  It was copiously irrigated with 1 L of saline.  Wound was explored without any obvious foreign body, no tendon or nerve involvement noted.  Patient tolerated procedure well.  Four horizontal mattress sutures and two simple interrupted sutures placed with close wound approximation.  Discussed risk of infection including signs/symptoms that would warrant immediate return with patient who voiced understanding.  Also discussed that he would need his sutures removed in about 10 days which could be done at an urgent care, his PCP, or he could return to the ED for this.  Hand exam after laceration repair remained stable.  Patient had 5/5 wrist extension and flexion suggesting low concern for tendon involvement.  Median, radial, and ulnar nerve sensory motor function intact distally including be able to make an okay sign, peace sign, thumbs up, and crossing his fingers.  Low concern for any tendon or nerve injury at this time. No other injuries on exam. Wound dressed with bacitracin/gauze. Patient provided with additional wound care supplies and given wound care instructions.  Strict return precautions provided. Encouraged him to follow-up with his PCP on an outpatient basis. Questions were answered.  Patient discharged in stable condition.  The plan for this patient was discussed with Dr. Criss Alvine, who voiced agreement and who oversaw evaluation and treatment of this patient.   Final Clinical Impression(s) / ED Diagnoses Final diagnoses:  Laceration of left forearm, initial encounter    Rx / DC Orders ED Discharge Orders    None       Gershon Mussel, MD 03/24/20 1448    Pricilla Loveless, MD 03/24/20 1301

## 2021-04-03 ENCOUNTER — Other Ambulatory Visit: Payer: Self-pay | Admitting: Student in an Organized Health Care Education/Training Program

## 2021-04-03 DIAGNOSIS — M5416 Radiculopathy, lumbar region: Secondary | ICD-10-CM

## 2021-05-07 ENCOUNTER — Inpatient Hospital Stay: Admission: RE | Admit: 2021-05-07 | Payer: Self-pay | Source: Ambulatory Visit

## 2022-07-02 ENCOUNTER — Other Ambulatory Visit: Payer: Self-pay

## 2022-07-02 ENCOUNTER — Encounter (HOSPITAL_BASED_OUTPATIENT_CLINIC_OR_DEPARTMENT_OTHER): Payer: Self-pay | Admitting: Emergency Medicine

## 2022-07-02 ENCOUNTER — Emergency Department (HOSPITAL_BASED_OUTPATIENT_CLINIC_OR_DEPARTMENT_OTHER)
Admission: EM | Admit: 2022-07-02 | Discharge: 2022-07-02 | Disposition: A | Discharge status: Home / Self Care | Payer: BC Managed Care – PPO | Attending: Emergency Medicine | Admitting: Emergency Medicine

## 2022-07-02 ENCOUNTER — Other Ambulatory Visit (HOSPITAL_BASED_OUTPATIENT_CLINIC_OR_DEPARTMENT_OTHER): Payer: Self-pay

## 2022-07-02 ENCOUNTER — Emergency Department (HOSPITAL_BASED_OUTPATIENT_CLINIC_OR_DEPARTMENT_OTHER): Payer: BC Managed Care – PPO

## 2022-07-02 DIAGNOSIS — N200 Calculus of kidney: Secondary | ICD-10-CM | POA: Diagnosis not present

## 2022-07-02 DIAGNOSIS — R1032 Left lower quadrant pain: Secondary | ICD-10-CM | POA: Diagnosis present

## 2022-07-02 DIAGNOSIS — R109 Unspecified abdominal pain: Secondary | ICD-10-CM

## 2022-07-02 LAB — URINALYSIS, ROUTINE W REFLEX MICROSCOPIC
Bacteria, UA: NONE SEEN
Bilirubin Urine: NEGATIVE
Glucose, UA: NEGATIVE mg/dL
Hgb urine dipstick: NEGATIVE
Ketones, ur: NEGATIVE mg/dL
Nitrite: NEGATIVE
Protein, ur: NEGATIVE mg/dL
Specific Gravity, Urine: 1.019 (ref 1.005–1.030)
pH: 6.5 (ref 5.0–8.0)

## 2022-07-02 LAB — CBC
HCT: 38.1 % — ABNORMAL LOW (ref 39.0–52.0)
Hemoglobin: 12.4 g/dL — ABNORMAL LOW (ref 13.0–17.0)
MCH: 23.8 pg — ABNORMAL LOW (ref 26.0–34.0)
MCHC: 32.5 g/dL (ref 30.0–36.0)
MCV: 73 fL — ABNORMAL LOW (ref 80.0–100.0)
Platelets: 298 10*3/uL (ref 150–400)
RBC: 5.22 MIL/uL (ref 4.22–5.81)
RDW: 15 % (ref 11.5–15.5)
WBC: 6.3 10*3/uL (ref 4.0–10.5)
nRBC: 0 % (ref 0.0–0.2)

## 2022-07-02 LAB — BASIC METABOLIC PANEL
Anion gap: 7 (ref 5–15)
BUN: 16 mg/dL (ref 6–20)
CO2: 25 mmol/L (ref 22–32)
Calcium: 9.7 mg/dL (ref 8.9–10.3)
Chloride: 104 mmol/L (ref 98–111)
Creatinine, Ser: 1.11 mg/dL (ref 0.61–1.24)
GFR, Estimated: >60 mL/min (ref >60)
Glucose, Bld: 107 mg/dL — ABNORMAL HIGH (ref 70–99)
Potassium: 3.8 mmol/L (ref 3.5–5.1)
Sodium: 136 mmol/L (ref 135–145)

## 2022-07-02 MED ORDER — CEPHALEXIN 500 MG PO CAPS: 500.0000 mg | ORAL_CAPSULE | Freq: Four times a day (QID) | ORAL | 0 refills | Status: AC

## 2022-07-02 MED ORDER — SODIUM CHLORIDE 0.9 % IV BOLUS
1000.0000 mL | Freq: Once | INTRAVENOUS | Status: AC
  Administered 2022-07-02: 1000 mL via INTRAVENOUS

## 2022-07-02 MED ORDER — KETOROLAC TROMETHAMINE 15 MG/ML IJ SOLN
30.0000 mg | Freq: Once | INTRAMUSCULAR | Status: AC
  Administered 2022-07-02: 30 mg via INTRAVENOUS
  Filled 2022-07-02: qty 2

## 2022-07-02 MED ORDER — IOHEXOL 300 MG/ML  SOLN
100.0000 mL | Freq: Once | INTRAMUSCULAR | Status: AC | PRN
  Administered 2022-07-02: 80 mL via INTRAVENOUS

## 2022-07-02 MED ORDER — ONDANSETRON HCL 4 MG/2ML IJ SOLN
4.0000 mg | Freq: Once | INTRAMUSCULAR | Status: AC
  Administered 2022-07-02: 4 mg via INTRAVENOUS
  Filled 2022-07-02: qty 2

## 2022-07-02 NOTE — Discharge Instructions (Addendum)
It was a pleasure taking care of you today!  Your labs didn't show any emergent findings at this time. Your CT scan showed concern for bulging discs and kidney stones that were non-obstructing to both of your kidneys. You will be sent a prescription for Keflex, take as directed. Attached is information for the orthopedist for follow up. Follow up with your primary care provider, if you do not have one, attached is information for [facility] to establish care with a primary care provider. Return to the ED if you are experiencing increasing/worsening symptoms.

## 2022-07-02 NOTE — ED Notes (Signed)
Discharge instructions, pain management, follow up care, and prescription reviewed and explained, pt verbalized understanding and had no further questions on d/c. Pt caox4, ambulatory, NAD on d/c.

## 2022-07-02 NOTE — ED Triage Notes (Signed)
Pt arrives to ED with c/o left sided flank pain that started today.

## 2022-07-02 NOTE — ED Provider Notes (Signed)
[facility] Provider Note   CSN: [id number] Arrival date & time: 07/02/22  0909     History  Chief Complaint  Patient presents with   Flank Pain    Mitchell Martin is a 32 y.o. male who presents emergency department with concerns for left-sided flank pain onset this morning.  Has a history of kidney stones with his last 1 being in 2021.  No meds tried prior to arrival.  Has associated nausea.  Denies abdominal pain, vomiting, urinary symptoms, penile pain/testicular pain, penile drainage/testicular swelling.  Denies concerns for STD at this time.  The history is provided by the patient. No language interpreter was used.       Home Medications Prior to Admission medications   Medication Sig Start Date End Date Taking? Authorizing Provider  acetaminophen (TYLENOL) 500 MG tablet Take 1,000 mg by mouth every 6 (six) hours as needed for mild pain or headache.    [provider]  cephALEXin (KEFLEX) 500 MG capsule Take 1 capsule (500 mg total) by mouth 4 (four) times daily for 7 days. 07/02/22 07/09/22  Ladasha Schnackenberg A, PA-C  cyclobenzaprine (FLEXERIL) 5 MG tablet Take 1 tablet (5 mg total) by mouth at bedtime. 10/17/18   Loura Halt A, NP  meloxicam (MOBIC) 15 MG tablet Take 1 tablet (15 mg total) by mouth daily. 10/17/18   Loura Halt A, NP  ondansetron (ZOFRAN ODT) 4 MG disintegrating tablet Take 1 tablet (4 mg total) by mouth every 8 (eight) hours as needed for nausea or vomiting. 09/30/18   Rancour, Annie Main, MD  oxyCODONE-acetaminophen (PERCOCET/ROXICET) 5-325 MG tablet Take 1 tablet by mouth every 6 (six) hours as needed for moderate pain. 10/03/18   Irine Seal, MD  promethazine (PHENERGAN) 12.5 MG tablet Take 1 tablet (12.5 mg total) by mouth every 6 (six) hours as needed. 09/28/18   Lily Kocher, PA-C      Allergies    Patient has no known allergies.    Review of Systems   Review of Systems  All other systems reviewed and are  negative.   Physical Exam Updated Vital Signs BP 138/89 (BP Location: Right Arm)   Pulse 70   Temp 97.9 F (36.6 C) (Oral)   Resp 16   Ht 5\' 11"  (1.803 m)   Wt 77.1 kg   SpO2 100%   BMI 23.71 kg/m  Physical Exam Vitals and nursing note reviewed.  Constitutional:      General: He is not in acute distress.    Appearance: He is not diaphoretic.  HENT:     Head: Normocephalic and atraumatic.     Mouth/Throat:     Pharynx: No oropharyngeal exudate.  Eyes:     General: No scleral icterus.    Conjunctiva/sclera: Conjunctivae normal.  Cardiovascular:     Rate and Rhythm: Normal rate and regular rhythm.     Pulses: Normal pulses.     Heart sounds: Normal heart sounds.  Pulmonary:     Effort: Pulmonary effort is normal. No respiratory distress.     Breath sounds: Normal breath sounds. No wheezing.  Abdominal:     General: Bowel sounds are normal.     Palpations: Abdomen is soft. There is no mass.     Tenderness: There is no abdominal tenderness. There is left CVA tenderness. There is no right CVA tenderness, guarding or rebound.     Comments: No abdominal tenderness to palpation.  Left CVA tenderness to palpation.  Musculoskeletal:  General: Normal range of motion.     Cervical back: Normal range of motion and neck supple.     Comments: No spinal TTP. No TTP noted to musculature of back. No overlying skin changes. Able to ambulate without assistance or difficulty. Strength intact to BLE.   Skin:    General: Skin is warm and dry.  Neurological:     Mental Status: He is alert.  Psychiatric:        Behavior: Behavior normal.     ED Results / Procedures / Treatments   Labs (all labs ordered are listed, but only abnormal results are displayed) Labs Reviewed  URINALYSIS, ROUTINE W REFLEX MICROSCOPIC - Abnormal; Notable for the following components:      Result Value   Leukocytes,Ua LARGE (*)    All other components within normal limits  BASIC METABOLIC PANEL -  Abnormal; Notable for the following components:   Glucose, Bld 107 (*)    All other components within normal limits  CBC - Abnormal; Notable for the following components:   Hemoglobin 12.4 (*)    HCT 38.1 (*)    MCV 73.0 (*)    MCH 23.8 (*)    All other components within normal limits    EKG None  Radiology CT ABDOMEN PELVIS W CONTRAST  Result Date: 07/02/2022 CLINICAL DATA:  Left flank pain starting today EXAM: CT ABDOMEN AND PELVIS WITH CONTRAST TECHNIQUE: Multidetector CT imaging of the abdomen and pelvis was performed using the standard protocol following bolus administration of intravenous contrast. RADIATION DOSE REDUCTION: This exam was performed according to the departmental dose-optimization program which includes automated exposure control, adjustment of the mA and/or kV according to patient size and/or use of iterative reconstruction technique. CONTRAST:  61mL OMNIPAQUE IOHEXOL 300 MG/ML  SOLN COMPARISON:  09/28/2018 FINDINGS: Lower chest: Unremarkable Hepatobiliary: Unremarkable Pancreas: Unremarkable Spleen: Unremarkable Adrenals/Urinary Tract: Three nonobstructive right kidney lower pole calculi, largest 0.3 cm in long axis. Two left kidney lower pole nonobstructive renal calculi, the largest 0.5 cm in long axis. No hydronephrosis, hydroureter, or significant focal renal lesion. Adrenal glands and urinary bladder unremarkable. Stomach/Bowel: Unremarkable Vascular/Lymphatic: Unremarkable Reproductive: Unremarkable Other: No supplemental non-categorized findings. Musculoskeletal: Diffuse disc bulge at L4-5, possible left greater than right foraminal narrowing resulting. IMPRESSION: 1. Bilateral nonobstructive nephrolithiasis. 2. Diffuse disc bulge at L4-5 with possible left greater than right foraminal narrowing resulting. Electronically Signed   By: Van Clines M.D.   On: 07/02/2022 10:42    Procedures Procedures    Medications Ordered in ED Medications  sodium chloride  0.9 % bolus 1,000 mL (0 mLs Intravenous Stopped 07/02/22 1150)  ondansetron (ZOFRAN) injection 4 mg (4 mg Intravenous Given 07/02/22 0928)  ketorolac (TORADOL) 15 MG/ML injection 30 mg (30 mg Intravenous Given 07/02/22 0928)  iohexol (OMNIPAQUE) 300 MG/ML solution 100 mL (80 mLs Intravenous Contrast Given 07/02/22 1017)    ED Course/ Medical Decision Making/ A&P Clinical Course as of 07/02/22 1242  Thu Jul 02, 2022  1005 Patient reevaluated and noted improvement of symptoms with treatment regimen in the ED. [SB]  1150 Discussed with patient lab and imaging findings. Discussed with patient discharge treatment plan. Answered all available questions. Pt appears safe for discharge at this time.  [SB]    Clinical Course User Index [SB] Ada Holness A, PA-C                             Medical Decision Making Amount  and/or Complexity of Data Reviewed Labs: ordered. Radiology: ordered.  Risk Prescription drug management.   Pt presents with left flank pain onset this morning. Has nausea.  history of kidney stones. Vital signs stable, pt afebrile.  On exam patient with left CVA TTP.  Otherwise no acute cardiovascular, respiratory, abdominal spine findings.  Differential diagnosis includes acute cystitis, pyelonephritis, nephrolithiasis.  Labs:  I ordered, and personally interpreted labs.  The pertinent results include: Urinalysis with large amount of leukocytes otherwise unremarkable CBC without leukocytosis BMP unremarkable  Imaging: I ordered imaging studies including CT abdomen pelvis I independently visualized and interpreted imaging which showed:  1. Bilateral nonobstructive nephrolithiasis.  2. Diffuse disc bulge at L4-5 with possible left greater than right  foraminal narrowing resulting.   I agree with the radiologist interpretation  Medications:  I ordered medication including toradol, IVF, zofran for symptom management Reevaluation of the patient after these medicines and  interventions, I reevaluated the patient and found that they have improved I have reviewed the patients home medicines and have made adjustments as needed    Disposition: Presentation suspicious for non-obstructing nephrolithiasis. Also notable for bulging discs, patient has a history of this in 2018-2019 and was previously evaluated by ortho for this. Doubt contusion, pyelonephritis at this time. After consideration of the diagnostic results and the patients response to treatment, I feel that the patient would benefit from Discharge home.  Patient provided with a prescription for Keflex to treat likely acute cystitis.  Patient denies concerns at this time for STD.  Patient provided with information for orthopedics for follow-up regarding findings on CT scan.  Work note provided.  Supportive care measures and strict return precautions discussed with patient at bedside. Pt acknowledges and verbalizes understanding. Pt appears safe for discharge. Follow up as indicated in discharge paperwork.   This chart was dictated using voice recognition software, Dragon. Despite the best efforts of this provider to proofread and correct errors, errors may still occur which can change documentation meaning.   Final Clinical Impression(s) / ED Diagnoses Final diagnoses:  Nephrolithiasis  Flank pain    Rx / DC Orders ED Discharge Orders          Ordered    cephALEXin (KEFLEX) 500 MG capsule  4 times daily        07/02/22 1202              Audwin Semper A, PA-C 07/02/22 1242    Jeanell Sparrow, DO 07/07/22 [phone]

## 2023-11-26 ENCOUNTER — Ambulatory Visit
Admission: EM | Admit: 2023-11-26 | Discharge: 2023-11-26 | Disposition: A | Discharge status: Home / Self Care | Payer: Self-pay | Attending: Family Medicine | Admitting: Family Medicine

## 2023-11-26 DIAGNOSIS — B354 Tinea corporis: Secondary | ICD-10-CM

## 2023-11-26 MED ORDER — FLUCONAZOLE 150 MG PO TABS: 150.0000 mg | ORAL_TABLET | ORAL | 0 refills | Status: AC

## 2023-11-26 NOTE — ED Triage Notes (Signed)
 Pt reports rash in left arm and left sides torso x 1 1/2 week. Denies itching. Neosporin gives no relief.

## 2023-11-26 NOTE — ED Provider Notes (Signed)
 [facility]  Note:  This document was prepared using Conservation officer, historic buildings and may include unintentional dictation errors.  MRN: [id number] DOB: 12/10/1990  Subjective:   Mitchell Martin is a 33 y.o. male presenting for 1.5-week history of persistent spots over the left arm and torso.  Feels like he is now getting them on his scalp.  Patient reports initially there was 1 spot on the medial left upper extremity.  Subsequently it started to spread to the left abdominal wall and is now on the back and feels a spot on his right temporal scalp.  No fever, drainage of pus or bleeding, tenderness, itching.  Has used Neosporin without relief.  No current facility-administered medications for this encounter.  Current Outpatient Medications:    acetaminophen  (TYLENOL ) 500 MG tablet, Take 1,000 mg by mouth every 6 (six) hours as needed for mild pain or headache., Disp: , Rfl:    cyclobenzaprine  (FLEXERIL ) 5 MG tablet, Take 1 tablet (5 mg total) by mouth at bedtime., Disp: 15 tablet, Rfl: 0   meloxicam  (MOBIC ) 15 MG tablet, Take 1 tablet (15 mg total) by mouth daily., Disp: 20 tablet, Rfl: 0   ondansetron  (ZOFRAN  ODT) 4 MG disintegrating tablet, Take 1 tablet (4 mg total) by mouth every 8 (eight) hours as needed for nausea or vomiting., Disp: 20 tablet, Rfl: 0   oxyCODONE -acetaminophen  (PERCOCET/ROXICET) 5-325 MG tablet, Take 1 tablet by mouth every 6 (six) hours as needed for moderate pain., Disp: 12 tablet, Rfl: 0   promethazine  (PHENERGAN ) 12.5 MG tablet, Take 1 tablet (12.5 mg total) by mouth every 6 (six) hours as needed., Disp: 10 tablet, Rfl: 0   No Known Allergies  Past Medical History:  Diagnosis Date   Back pain    Kidney stone      Past Surgical History:  Procedure Laterality Date   CYSTOSCOPY/URETEROSCOPY/HOLMIUM LASER/STENT PLACEMENT Right 10/03/2018   Procedure: CYSTOSCOPY/RIGHT URETEROSCOPY/HOLMIUM LASER/STENT PLACEMENT;  Surgeon: Watt Rush,  MD;  Location: [facility];  Service: Urology;  Laterality: Right;    History reviewed. No pertinent family history.  Social History   Tobacco Use   Smoking status: Former   Smokeless tobacco: Never  Advertising account planner   Vaping status: Never Used  Substance Use Topics   Alcohol use: No   Drug use: No    ROS   Objective:   Vitals: BP 125/72 (BP Location: Right Arm)   Pulse 70   Temp 98.4 F (36.9 C) (Oral)   Resp 18   SpO2 97%   Physical Exam Constitutional:      General: He is not in acute distress.    Appearance: Normal appearance. He is well-developed and normal weight. He is not ill-appearing, toxic-appearing or diaphoretic.  HENT:     Head: Normocephalic and atraumatic.     Right Ear: External ear normal.     Left Ear: External ear normal.     Nose: Nose normal.     Mouth/Throat:     Pharynx: Oropharynx is clear.  Eyes:     General: No scleral icterus.       Right eye: No discharge.        Left eye: No discharge.     Extraocular Movements: Extraocular movements intact.  Cardiovascular:     Rate and Rhythm: Normal rate.  Pulmonary:     Effort: Pulmonary effort is normal.  Musculoskeletal:     Cervical back: Normal range of motion.  Skin:    Findings:  Rash (multiple scattered annular lesions of varying size with dry scaly hyperpigmentation) present.  Neurological:     Mental Status: He is alert and oriented to person, place, and time.  Psychiatric:        Mood and Affect: Mood normal.        Behavior: Behavior normal.        Thought Content: Thought content normal.        Judgment: Judgment normal.           Assessment and Plan :   PDMP not reviewed this encounter.  1. Tinea corporis    By report, patient sees similar lesions on his thighs.  As such, given the involvement on the torso, left arm, thighs bilaterally recommended oral fluconazole .  Counseled patient on potential for adverse effects with medications prescribed/recommended today, ER and  return-to-clinic precautions discussed, patient verbalized understanding.    Christopher Savannah, [place] 11/26/23 1605
